# Patient Record
Sex: Female | Born: 1980 | Race: Black or African American | Hispanic: Yes | Marital: Single | Smoking: Current every day smoker
Health system: Southern US, Community
[De-identification: ages and names within clinical notes are randomized; demographics above are authoritative.]

## PROBLEM LIST (undated history)

## (undated) DIAGNOSIS — D649 Anemia, unspecified: Secondary | ICD-10-CM

## (undated) DIAGNOSIS — R519 Headache, unspecified: Secondary | ICD-10-CM

## (undated) DIAGNOSIS — L709 Acne, unspecified: Secondary | ICD-10-CM

## (undated) DIAGNOSIS — R51 Headache: Secondary | ICD-10-CM

## (undated) DIAGNOSIS — IMO0001 Reserved for inherently not codable concepts without codable children: Secondary | ICD-10-CM

## (undated) DIAGNOSIS — I499 Cardiac arrhythmia, unspecified: Secondary | ICD-10-CM

## (undated) DIAGNOSIS — K219 Gastro-esophageal reflux disease without esophagitis: Secondary | ICD-10-CM

## (undated) HISTORY — PX: CHOLECYSTECTOMY: SHX55

## (undated) HISTORY — DX: Headache, unspecified: R51.9

## (undated) HISTORY — DX: Anemia, unspecified: D64.9

## (undated) HISTORY — PX: TUBAL LIGATION: SHX77

## (undated) HISTORY — DX: Headache: R51

## (undated) HISTORY — DX: Acne, unspecified: L70.9

---

## 1998-05-15 ENCOUNTER — Emergency Department (HOSPITAL_COMMUNITY): Admission: EM | Admit: 1998-05-15 | Discharge: 1998-05-15 | Payer: Self-pay | Admitting: Emergency Medicine

## 1999-10-22 ENCOUNTER — Inpatient Hospital Stay (HOSPITAL_COMMUNITY): Admission: AD | Admit: 1999-10-22 | Discharge: 1999-10-22 | Payer: Self-pay | Admitting: Obstetrics

## 1999-12-18 ENCOUNTER — Ambulatory Visit (HOSPITAL_COMMUNITY): Admission: RE | Admit: 1999-12-18 | Discharge: 1999-12-18 | Payer: Self-pay | Admitting: Obstetrics & Gynecology

## 2000-01-03 ENCOUNTER — Ambulatory Visit (HOSPITAL_COMMUNITY): Admission: RE | Admit: 2000-01-03 | Discharge: 2000-01-03 | Payer: Self-pay | Admitting: *Deleted

## 2000-03-12 ENCOUNTER — Inpatient Hospital Stay (HOSPITAL_COMMUNITY): Admission: AD | Admit: 2000-03-12 | Discharge: 2000-03-12 | Payer: Self-pay | Admitting: *Deleted

## 2000-03-13 ENCOUNTER — Inpatient Hospital Stay (HOSPITAL_COMMUNITY): Admission: AD | Admit: 2000-03-13 | Discharge: 2000-03-13 | Payer: Self-pay | Admitting: *Deleted

## 2000-04-21 ENCOUNTER — Inpatient Hospital Stay (HOSPITAL_COMMUNITY): Admission: AD | Admit: 2000-04-21 | Discharge: 2000-04-21 | Payer: Self-pay | Admitting: Obstetrics

## 2000-05-22 ENCOUNTER — Encounter (HOSPITAL_COMMUNITY): Admission: RE | Admit: 2000-05-22 | Discharge: 2000-05-29 | Payer: Self-pay | Admitting: Obstetrics & Gynecology

## 2000-05-28 ENCOUNTER — Inpatient Hospital Stay (HOSPITAL_COMMUNITY): Admission: AD | Admit: 2000-05-28 | Discharge: 2000-05-28 | Payer: Self-pay | Admitting: *Deleted

## 2000-05-28 ENCOUNTER — Inpatient Hospital Stay (HOSPITAL_COMMUNITY): Admission: AD | Admit: 2000-05-28 | Discharge: 2000-05-30 | Payer: Self-pay | Admitting: *Deleted

## 2001-04-04 ENCOUNTER — Emergency Department (HOSPITAL_COMMUNITY): Admission: EM | Admit: 2001-04-04 | Discharge: 2001-04-05 | Payer: Self-pay | Admitting: Emergency Medicine

## 2001-06-18 ENCOUNTER — Inpatient Hospital Stay (HOSPITAL_COMMUNITY): Admission: AD | Admit: 2001-06-18 | Discharge: 2001-06-18 | Payer: Self-pay | Admitting: Obstetrics & Gynecology

## 2001-10-27 ENCOUNTER — Emergency Department (HOSPITAL_COMMUNITY): Admission: EM | Admit: 2001-10-27 | Discharge: 2001-10-27 | Payer: Self-pay | Admitting: Emergency Medicine

## 2002-01-09 ENCOUNTER — Emergency Department (HOSPITAL_COMMUNITY): Admission: EM | Admit: 2002-01-09 | Discharge: 2002-01-09 | Payer: Self-pay | Admitting: Emergency Medicine

## 2002-02-17 ENCOUNTER — Emergency Department (HOSPITAL_COMMUNITY): Admission: EM | Admit: 2002-02-17 | Discharge: 2002-02-17 | Payer: Self-pay | Admitting: Emergency Medicine

## 2002-06-07 ENCOUNTER — Ambulatory Visit (HOSPITAL_COMMUNITY): Admission: RE | Admit: 2002-06-07 | Discharge: 2002-06-07 | Payer: Self-pay | Admitting: *Deleted

## 2002-07-14 ENCOUNTER — Ambulatory Visit (HOSPITAL_COMMUNITY): Admission: RE | Admit: 2002-07-14 | Discharge: 2002-07-14 | Payer: Self-pay | Admitting: *Deleted

## 2002-12-11 ENCOUNTER — Inpatient Hospital Stay (HOSPITAL_COMMUNITY): Admission: AD | Admit: 2002-12-11 | Discharge: 2002-12-11 | Payer: Self-pay | Admitting: *Deleted

## 2002-12-17 ENCOUNTER — Encounter (HOSPITAL_COMMUNITY): Admission: RE | Admit: 2002-12-17 | Discharge: 2002-12-19 | Payer: Self-pay | Admitting: *Deleted

## 2002-12-20 ENCOUNTER — Inpatient Hospital Stay (HOSPITAL_COMMUNITY): Admission: AD | Admit: 2002-12-20 | Discharge: 2002-12-23 | Payer: Self-pay | Admitting: *Deleted

## 2002-12-21 ENCOUNTER — Encounter (INDEPENDENT_AMBULATORY_CARE_PROVIDER_SITE_OTHER): Payer: Self-pay | Admitting: Specialist

## 2002-12-22 ENCOUNTER — Encounter: Payer: Self-pay | Admitting: *Deleted

## 2003-10-20 ENCOUNTER — Emergency Department (HOSPITAL_COMMUNITY): Admission: EM | Admit: 2003-10-20 | Discharge: 2003-10-20 | Payer: Self-pay | Admitting: Emergency Medicine

## 2003-12-08 ENCOUNTER — Emergency Department (HOSPITAL_COMMUNITY): Admission: EM | Admit: 2003-12-08 | Discharge: 2003-12-08 | Payer: Self-pay | Admitting: Family Medicine

## 2004-01-08 ENCOUNTER — Emergency Department (HOSPITAL_COMMUNITY): Admission: EM | Admit: 2004-01-08 | Discharge: 2004-01-08 | Payer: Self-pay

## 2004-01-12 ENCOUNTER — Inpatient Hospital Stay (HOSPITAL_COMMUNITY): Admission: AD | Admit: 2004-01-12 | Discharge: 2004-01-12 | Payer: Self-pay | Admitting: Obstetrics & Gynecology

## 2004-01-17 ENCOUNTER — Other Ambulatory Visit: Admission: RE | Admit: 2004-01-17 | Discharge: 2004-01-17 | Payer: Self-pay | Admitting: Family Medicine

## 2004-01-17 ENCOUNTER — Encounter: Admission: RE | Admit: 2004-01-17 | Discharge: 2004-01-17 | Payer: Self-pay | Admitting: Family Medicine

## 2004-01-26 ENCOUNTER — Observation Stay (HOSPITAL_COMMUNITY): Admission: RE | Admit: 2004-01-26 | Discharge: 2004-01-27 | Payer: Self-pay | Admitting: General Surgery

## 2004-01-26 ENCOUNTER — Encounter (INDEPENDENT_AMBULATORY_CARE_PROVIDER_SITE_OTHER): Payer: Self-pay | Admitting: *Deleted

## 2004-02-02 ENCOUNTER — Encounter: Admission: RE | Admit: 2004-02-02 | Discharge: 2004-02-02 | Payer: Self-pay | Admitting: Family Medicine

## 2004-05-04 ENCOUNTER — Emergency Department (HOSPITAL_COMMUNITY): Admission: EM | Admit: 2004-05-04 | Discharge: 2004-05-04 | Payer: Self-pay | Admitting: Emergency Medicine

## 2006-11-27 DIAGNOSIS — K8020 Calculus of gallbladder without cholecystitis without obstruction: Secondary | ICD-10-CM | POA: Insufficient documentation

## 2006-11-27 DIAGNOSIS — K219 Gastro-esophageal reflux disease without esophagitis: Secondary | ICD-10-CM

## 2007-08-10 ENCOUNTER — Ambulatory Visit: Payer: Self-pay | Admitting: Family Medicine

## 2007-08-10 ENCOUNTER — Encounter: Payer: Self-pay | Admitting: Family Medicine

## 2007-08-10 DIAGNOSIS — N898 Other specified noninflammatory disorders of vagina: Secondary | ICD-10-CM | POA: Insufficient documentation

## 2007-08-10 LAB — CONVERTED CEMR LAB
Beta hcg, urine, semiquantitative: NEGATIVE
Whiff Test: POSITIVE

## 2007-08-11 ENCOUNTER — Encounter: Payer: Self-pay | Admitting: Family Medicine

## 2007-08-11 LAB — CONVERTED CEMR LAB
GC Probe Amp, Genital: NEGATIVE
RBC: 4.06 M/uL (ref 3.87–5.11)
TSH: 0.238 microintl units/mL — ABNORMAL LOW (ref 0.350–5.50)

## 2007-08-12 ENCOUNTER — Encounter: Payer: Self-pay | Admitting: Family Medicine

## 2007-08-12 LAB — CONVERTED CEMR LAB: T3, Free: 3 pg/mL (ref 2.3–4.2)

## 2007-08-13 ENCOUNTER — Encounter: Admission: RE | Admit: 2007-08-13 | Discharge: 2007-08-13 | Payer: Self-pay | Admitting: Family Medicine

## 2007-11-09 ENCOUNTER — Emergency Department (HOSPITAL_COMMUNITY): Admission: EM | Admit: 2007-11-09 | Discharge: 2007-11-09 | Payer: Self-pay | Admitting: Family Medicine

## 2008-01-07 ENCOUNTER — Emergency Department (HOSPITAL_COMMUNITY): Admission: EM | Admit: 2008-01-07 | Discharge: 2008-01-07 | Payer: Self-pay | Admitting: Emergency Medicine

## 2008-07-04 ENCOUNTER — Encounter: Payer: Self-pay | Admitting: Family Medicine

## 2008-10-07 ENCOUNTER — Encounter: Payer: Self-pay | Admitting: Family Medicine

## 2008-10-07 ENCOUNTER — Ambulatory Visit: Payer: Self-pay | Admitting: Family Medicine

## 2008-10-07 DIAGNOSIS — N920 Excessive and frequent menstruation with regular cycle: Secondary | ICD-10-CM

## 2008-10-07 DIAGNOSIS — F17200 Nicotine dependence, unspecified, uncomplicated: Secondary | ICD-10-CM

## 2008-10-07 DIAGNOSIS — D509 Iron deficiency anemia, unspecified: Secondary | ICD-10-CM

## 2008-10-07 DIAGNOSIS — K5900 Constipation, unspecified: Secondary | ICD-10-CM | POA: Insufficient documentation

## 2008-10-10 LAB — CONVERTED CEMR LAB
HCT: 28.2 % — ABNORMAL LOW (ref 36.0–46.0)
Platelets: 486 10*3/uL — ABNORMAL HIGH (ref 150–400)
RBC: 3.78 M/uL — ABNORMAL LOW (ref 3.87–5.11)
RDW: 18.5 % — ABNORMAL HIGH (ref 11.5–15.5)
UIBC: 353 ug/dL

## 2009-04-06 ENCOUNTER — Emergency Department (HOSPITAL_COMMUNITY): Admission: EM | Admit: 2009-04-06 | Discharge: 2009-04-06 | Payer: Self-pay | Admitting: Emergency Medicine

## 2009-12-01 ENCOUNTER — Emergency Department (HOSPITAL_COMMUNITY): Admission: EM | Admit: 2009-12-01 | Discharge: 2009-12-01 | Payer: Self-pay | Admitting: Emergency Medicine

## 2009-12-22 ENCOUNTER — Ambulatory Visit: Payer: Self-pay | Admitting: Family Medicine

## 2010-02-05 ENCOUNTER — Telehealth: Payer: Self-pay | Admitting: Family Medicine

## 2010-02-06 ENCOUNTER — Ambulatory Visit: Payer: Self-pay | Admitting: Family Medicine

## 2010-02-06 LAB — CONVERTED CEMR LAB: Whiff Test: NEGATIVE

## 2010-02-08 ENCOUNTER — Telehealth: Payer: Self-pay | Admitting: Family Medicine

## 2010-02-20 ENCOUNTER — Encounter: Payer: Self-pay | Admitting: Family Medicine

## 2010-07-31 ENCOUNTER — Encounter: Payer: Self-pay | Admitting: Family Medicine

## 2010-10-30 NOTE — Progress Notes (Signed)
Summary: triage  Phone Note Call from Patient Call back at Home Phone [phone]   Caller: Patient Summary of Call: Pt thinks she has another yeast infection and wondering if she needs to be seen again or can something be called in for her. Initial call taken by: Clydell Hakim,  Feb 05, 2010 10:49 AM  Follow-up for Phone Call        appt made with pcp tomorrow at 4:15. pt needed to work around her school schedule Follow-up by: Golden Circle RN,  Feb 05, 2010 10:53 AM

## 2010-10-30 NOTE — Assessment & Plan Note (Signed)
Summary: vag problem/[place]   Vital Signs:  Patient profile:   30 year old female Height:      63 inches Weight:      154 pounds BMI:     27.38 Temp:     98.1 degrees F oral Pulse rate:   82 / minute BP sitting:   96 / 70  (right arm) Cuff size:   regular  Vitals Entered By: Tessie Fass CMA (Feb 06, 2010 4:07 PM) CC: yeast infection? Is Patient Diabetic? No Pain Assessment Patient in pain? no        CC:  yeast infection?.  History of Present Illness: Describes severe vaginal itch and irritation.  Husband is only partner, they have a good relationship. Occasionally uses metrogel for odor, has not used it recently.  Denies being on antibotics.  Past history of vaginal candidiasis.  Denies abdominal pain, vaginal discharge, or dysuria.  Habits & Providers  Alcohol-Tobacco-Diet     Tobacco Status: current     Tobacco Counseling: to quit use of tobacco products     Cigarette Packs/Day: 1.0  Current Medications (verified): 1)  Ferrous Sulfate 325 (65 Fe) Mg Tbec (Ferrous Sulfate) 2)  Cleocin-T 1 % Lotn (Clindamycin Phosphate) .... Apply Two Times A Day , 1 Unti 3)  Metronidazole 0.75 % Gel (Metronidazole) .... Intervaginal Gel, One Applicator Full At Bedtime For 3 Nights To Treat Symptoms, Qs 4)  Fluconazole 150 Mg Tabs (Fluconazole) .... Take One Tab By Mouth X1, May Repeat in 3 Days If Symptoms Continue  Allergies: No Known Drug Allergies  Social History: Packs/Day:  1.0  Review of Systems      See HPI  Physical Exam  General:  Well-developed,well-nourished,in no acute distress; alert,appropriate and cooperative throughout examination Genitalia:  normal introitus, white vaginal discharge, red inflammed vaginal wall.  Wet prep did not show yeast but clinically appeard so.   Impression & Recommendations:  Problem # 1:  CANDIDIASIS, VAGINAL (ICD-112.1) Add topical antifungal cream for 3 nights. Her updated medication list for this problem includes:  Fluconazole 150 Mg Tabs (Fluconazole) .Marland Kitchen... Take one tab by mouth x1, may repeat in 3 days if symptoms continue  Orders: Glucose Cap-[facility] (84132) Wet Prep- [facility] (44010) [facility]- Est Level  3 (27253)  Complete Medication List: 1)  Ferrous Sulfate 325 (65 Fe) Mg Tbec (Ferrous sulfate) 2)  Cleocin-t 1 % Lotn (Clindamycin phosphate) .... Apply two times a day , 1 unti 3)  Metronidazole 0.75 % Gel (Metronidazole) .... Intervaginal gel, one applicator full at bedtime for 3 nights to treat symptoms, qs 4)  Fluconazole 150 Mg Tabs (Fluconazole) .... Take one tab by mouth x1, may repeat in 3 days if symptoms continue  Patient Instructions: 1)  clotrimazole or miconazole intravaginal cream use for 3 nights before you go to bed  Laboratory Results  Date/Time Received: Feb 06, 2010 4:24 PM   [employer] Source: vaginal WBC/hpf: 1-5 Bacteria/hpf: 3+  Rods Clue cells/hpf: none  Negative whiff Yeast/hpf: none Trichomonas/hpf: none Comments: occ sperm present ...........test performed by...........Marland KitchenTerese Door, CMA

## 2010-10-30 NOTE — Miscellaneous (Signed)
  Clinical Lists Changes  Problems: Removed problem of CANDIDIASIS, VAGINAL (ICD-112.1) Removed problem of CONTACT OR EXPOSURE TO OTHER VIRAL DISEASES (ICD-V01.79) Removed problem of SCREENING FOR MALIGNANT NEOPLASM OF THE CERVIX (ICD-V76.2) Removed problem of ABSENCE OF MENSTRUATION (ICD-626.0)

## 2010-10-30 NOTE — Progress Notes (Signed)
Summary: meds prob  Phone Note Call from Patient Call back at Home Phone [phone]   Caller: Patient Summary of Call: pt states that she hasn't gotten her rx - needs [facility] [address] Initial call taken by: De Nurse,  Feb 08, 2010 2:04 PM  Follow-up for Phone Call        she is talking about the fluconazole. told her I will send this to [facility] & she will send to pharmacy Follow-up by: Golden Circle RN,  Feb 08, 2010 2:14 PM    Prescriptions: FLUCONAZOLE 150 MG TABS (FLUCONAZOLE) take one tab by mouth x1, may repeat in 3 days if symptoms continue  #2 x 0   Entered and Authorized by:   Luretha Murphy NP   Signed by:   Luretha Murphy NP on 02/08/2010   Method used:   Electronically to        [facility] [id number]       [address]       [place], [place]  [address]       Ph: [phone] or [phone]       Fax: [phone]   RxID:   [id number]

## 2010-10-30 NOTE — Consult Note (Signed)
Summary: Administrator, [employer] Service   Imported By: De Nurse 02/20/2010 16:03:56  _____________________________________________________________________  External Attachment:    Type:   Image     Comment:   External Document

## 2010-10-30 NOTE — Assessment & Plan Note (Signed)
Summary: cpe/pap,tcb   Vital Signs:  Patient profile:   30 year old female Height:      63 inches Weight:      154.9 pounds BMI:     27.54 Pulse rate:   90 / minute BP sitting:   104 / 60  (left arm)  Vitals Entered By: Arlyss Repress CMA, (December 22, 2009 1:33 PM) CC: physical. pap. vag d/c x 1 day Is Patient Diabetic? No Pain Assessment Patient in pain? no        CC:  physical. pap. vag d/c x 1 day.  History of Present Illness: Patient here for CPE and c/o vaginal itching and irritation that began after being on abx therapy two weeks ago.  Discharge is thick and without odor, not associated with abdominal pain, n/v, or dysuria.  Unsure of LMP, hx of tubal ligation and patient is married.  Expresses no concern for STD.   Habits & Providers  Alcohol-Tobacco-Diet     Tobacco Status: current     Tobacco Counseling: to quit use of tobacco products  Current Medications (verified): 1)  Ferrous Sulfate 325 (65 Fe) Mg Tbec (Ferrous Sulfate) 2)  Cleocin-T 1 % Lotn (Clindamycin Phosphate) .... Apply Two Times A Day , 1 Unti 3)  Metronidazole 0.75 % Gel (Metronidazole) .... Intervaginal Gel, One Applicator Full At Bedtime For 3 Nights To Treat Symptoms, Qs 4)  Fluconazole 150 Mg Tabs (Fluconazole) .... Take One Tab By Mouth X1, May Repeat in 3 Days If Symptoms Continue  Allergies (verified): No Known Drug Allergies  Physical Exam  General:  Well-developed,well-nourished,in no acute distress; alert,appropriate and cooperative throughout examination Head:  Normocephalic and atraumatic without obvious abnormalities. No apparent alopecia or balding. Eyes:  No corneal or conjunctival inflammation noted. EOMI. Perrla. Vision grossly normal. Ears:  External ear exam shows no significant lesions or deformities.  Otoscopic examination reveals clear canals, tympanic membranes are intact bilaterally without bulging, retraction, inflammation or discharge. Hearing is grossly normal  bilaterally. Mouth:  Oral mucosa and oropharynx without lesions or exudates.  Teeth in good repair. Neck:  No deformities, masses, or tenderness noted. Breasts:  No mass, nodules, thickening, tenderness, bulging, retraction, inflamation, nipple discharge or skin changes noted.   Lungs:  Normal respiratory effort, chest expands symmetrically. Lungs are clear to auscultation, no crackles or wheezes. Heart:  Normal rate and regular rhythm. S1 and S2 normal without gallop, murmur, click, rub or other extra sounds. Abdomen:  Bowel sounds positive,abdomen soft and non-tender without masses, organomegaly or hernias noted. Genitalia:  Normal introitus for age, no external lesions, copious amount thick white vaginal discharge, mucosa pink and moist, no vaginal or cervical lesions, no vaginal atrophy, no friaility or hemorrhage, normal uterus size and position, no adnexal masses or tenderness Msk:  No deformity or scoliosis noted of thoracic or lumbar spine.   Pulses:  R and L carotid,radial,femoral,dorsalis pedis and posterior tibial pulses are full and equal bilaterally Extremities:  No clubbing, cyanosis, edema, or deformity noted with normal full range of motion of all joints.   Neurologic:  No cranial nerve deficits noted. Station and gait are normal. Plantar reflexes are down-going bilaterally. DTRs are symmetrical throughout. Sensory, motor and coordinative functions appear intact. Skin:  Intact without suspicious lesions or rashes Cervical Nodes:  No lymphadenopathy noted Axillary Nodes:  No palpable lymphadenopathy Inguinal Nodes:  No significant adenopathy Psych:  Cognition and judgment appear intact. Alert and cooperative with normal attention span and concentration. No apparent delusions, illusions, hallucinations  Impression & Recommendations:  Problem # 1:  Gynecological examination-routine (ICD-V72.31)  Problem # 2:  CANDIDIASIS, VAGINAL (ICD-112.1)  Her updated medication list for  this problem includes:    Fluconazole 150 Mg Tabs (Fluconazole) .Marland Kitchen... Take one tab by mouth x1, may repeat in 3 days if symptoms continue  Orders: [facility] - Est  18-39 yrs ([address])  Complete Medication List: 1)  Ferrous Sulfate 325 (65 Fe) Mg Tbec (Ferrous sulfate) 2)  Cleocin-t 1 % Lotn (Clindamycin phosphate) .... Apply two times a day , 1 unti 3)  Metronidazole 0.75 % Gel (Metronidazole) .... Intervaginal gel, one applicator full at bedtime for 3 nights to treat symptoms, qs 4)  Fluconazole 150 Mg Tabs (Fluconazole) .... Take one tab by mouth x1, may repeat in 3 days if symptoms continue  [facility] Pap Sm[facility] ([id number])  Patient Instructions: 1)  Self Breast Exams Monthly. 2)  Take medication as prescribed. 3)  Condoms for STD Prevention.   4)  Return if symptoms persist or worsen. Prescriptions: FLUCONAZOLE 150 MG TABS (FLUCONAZOLE) take one tab by mouth x1, may repeat in 3 days if symptoms continue  #2 x 0   Entered and Authorized by:   Luretha Murphy NP   Signed by:   Luretha Murphy NP on 12/22/2009   Method used:   Electronically to        [facility]       [address]       [place], [place]  [address]       Ph: [phone] or [phone]       Fax: [phone]   RxID:   [id number]   Laboratory Results  Date/Time Received: December 22, 2009 1:58 PM  Date/Time Reported: December 22, 2009 2:08 PM   Wet [place] Source: vag WBC/hpf: rare Bacteria/hpf: 1+  Rods Clue cells/hpf: none  Negative whiff Yeast/hpf: few spores and hyphae Trichomonas/hpf: none Comments: ...............test performed by......Marland KitchenBonnie A. [place], MLS (ASCP)cm

## 2010-10-30 NOTE — Miscellaneous (Signed)
  Clinical Lists Changes  Medications: Changed medication from FERROUS SULFATE 325 (65 FE) MG TBEC (FERROUS SULFATE) to FERROUS SULFATE 325 (65 FE) MG TBEC (FERROUS SULFATE) c [BMN] - Signed Rx of FERROUS SULFATE 325 (65 FE) MG TBEC (FERROUS SULFATE) c;  #1 x 0 Brand medically necessary;  Signed;  Entered by: Luretha Murphy NP;  Authorized by: Luretha Murphy NP;  Method used: Print then Give to Patient    Prescriptions: FERROUS SULFATE 325 (65 FE) MG TBEC (FERROUS SULFATE) c Brand medically necessary #1 x 0   Entered and Authorized by:   Luretha Murphy NP   Signed by:   Luretha Murphy NP on 07/31/2010   Method used:   Print then Give to Patient   RxID:   [id number]

## 2010-11-07 ENCOUNTER — Encounter: Payer: Self-pay | Admitting: *Deleted

## 2010-12-18 LAB — GLUCOSE, CAPILLARY: Glucose-Capillary: 83 mg/dL (ref 70–99)

## 2011-01-22 ENCOUNTER — Ambulatory Visit (INDEPENDENT_AMBULATORY_CARE_PROVIDER_SITE_OTHER): Payer: Medicaid Other | Admitting: Family Medicine

## 2011-01-22 ENCOUNTER — Encounter: Payer: Self-pay | Admitting: Family Medicine

## 2011-01-22 VITALS — BP 109/69 | HR 81 | Temp 98.5°F | Ht 61.0 in | Wt 156.6 lb

## 2011-01-22 DIAGNOSIS — D649 Anemia, unspecified: Secondary | ICD-10-CM

## 2011-01-22 DIAGNOSIS — L708 Other acne: Secondary | ICD-10-CM

## 2011-01-22 DIAGNOSIS — Z23 Encounter for immunization: Secondary | ICD-10-CM

## 2011-01-22 DIAGNOSIS — L709 Acne, unspecified: Secondary | ICD-10-CM

## 2011-01-22 DIAGNOSIS — N760 Acute vaginitis: Secondary | ICD-10-CM

## 2011-01-22 DIAGNOSIS — B9689 Other specified bacterial agents as the cause of diseases classified elsewhere: Secondary | ICD-10-CM | POA: Insufficient documentation

## 2011-01-22 DIAGNOSIS — A499 Bacterial infection, unspecified: Secondary | ICD-10-CM

## 2011-01-22 HISTORY — DX: Acne, unspecified: L70.9

## 2011-01-22 LAB — CBC
HCT: 31.7 % — ABNORMAL LOW (ref 36.0–46.0)
Hemoglobin: 9.8 g/dL — ABNORMAL LOW (ref 12.0–15.0)
MCH: 25.9 pg — ABNORMAL LOW (ref 26.0–34.0)
Platelets: 388 10*3/uL (ref 150–400)
RBC: 3.78 MIL/uL — ABNORMAL LOW (ref 3.87–5.11)
RDW: 18.2 % — ABNORMAL HIGH (ref 11.5–15.5)
WBC: 6.9 10*3/uL (ref 4.0–10.5)

## 2011-01-22 MED ORDER — CLINDAMYCIN PHOSPHATE 1 % EX LOTN: TOPICAL_LOTION | Freq: Two times a day (BID) | CUTANEOUS | Status: DC

## 2011-01-22 MED ORDER — TETANUS-DIPHTH-ACELL PERTUSSIS 5-2.5-18.5 LF-MCG/0.5 IM SUSP
0.5000 mL | Freq: Once | INTRAMUSCULAR | Status: AC
  Administered 2011-01-22: 0.5 mL via INTRAMUSCULAR

## 2011-01-22 MED ORDER — METRONIDAZOLE 0.75 % VA GEL: 1.0000 | Freq: Two times a day (BID) | VAGINAL | Status: AC

## 2011-01-22 NOTE — Assessment & Plan Note (Signed)
Refilled metrogel for intermittent self treatment, low risk for STD

## 2011-01-22 NOTE — Assessment & Plan Note (Signed)
CBC and restart iron as she is experiencing pica

## 2011-01-22 NOTE — Progress Notes (Signed)
Addended byJimmy Footman on: 01/22/2011 05:07 PM   Modules accepted: Orders

## 2011-01-22 NOTE — Patient Instructions (Signed)
Iron sulfate 325 mg one-two daily for 6 months Advise to think about quitting tobacco Eat healthy and get regular exercise

## 2011-01-22 NOTE — Progress Notes (Signed)
  Subjective:    Patient ID: Kaitlyn West, female    DOB: 09-Jul-1981, 30 y.o.   MRN: [id number]  HPI Annual check up, has no major complaints.  Has started craving ice recently, took her iron some years ago but stopped.  Menses are about 5 days and not severe.  Has had long standing anemia.  In school and working, has two girls.  Honors grades for dental hygiene.  Intermittent BV, self treats when she gets the odor.   Review of Systems  Constitutional: Negative for fever and fatigue.  HENT: Negative for rhinorrhea.   Respiratory: Negative for cough and shortness of breath.   Cardiovascular: Negative for chest pain.  Gastrointestinal: Negative for constipation.  Genitourinary: Negative for dysuria and vaginal discharge.  Musculoskeletal: Negative.   Skin:       Dark circles under eyes, acne  Psychiatric/Behavioral: Negative for dysphoric mood.       Objective:   Physical Exam  Constitutional: She appears well-developed and well-nourished.  HENT:  Right Ear: External ear normal.  Left Ear: External ear normal.  Mouth/Throat: Oropharynx is clear and moist.  Eyes: Conjunctivae and EOM are normal. Pupils are equal, round, and reactive to light.  Neck: Normal range of motion. No thyromegaly present.  Cardiovascular: Normal rate, regular rhythm and normal heart sounds.   Pulmonary/Chest: Effort normal and breath sounds normal.  Abdominal: Soft. Bowel sounds are normal.  Genitourinary:       Deferred, normal PAP, not at risk for STD  Musculoskeletal: Normal range of motion.  Lymphadenopathy:    She has no cervical adenopathy.  Skin:       Mild acne, multiple tattoos  Psychiatric: She has a normal mood and affect.          Assessment & Plan:

## 2011-01-22 NOTE — Assessment & Plan Note (Signed)
Counseled to quit, no quit ready

## 2011-01-23 ENCOUNTER — Encounter: Payer: Self-pay | Admitting: Family Medicine

## 2011-02-15 NOTE — Op Note (Signed)
NAMEYEXALEN, DEIKE                          ACCOUNT NO.:  [id number]   MEDICAL RECORD NO.:  [id number]                   PATIENT TYPE:  INP   LOCATION:  9130                                 FACILITY:  [facility]   PHYSICIAN:  Phil D. Okey Dupre, M.D.                  DATE OF BIRTH:  08-07-1981   DATE OF PROCEDURE:  12/21/2002  DATE OF DISCHARGE:                                 OPERATIVE REPORT   PROCEDURE:  Modified Pomeroy bilateral tubal ligation and partial  salpingectomy.   PREOPERATIVE DIAGNOSES:  Voluntary sterilization.   POSTOPERATIVE DIAGNOSES:  Voluntary sterilization.   SURGEON:  Javier Glazier. Okey Dupre, M.D.   PROCEDURE:  On the morning after delivery the patient was taken to the  operating room, placed in a dorsal supine position, and had her epidural  anesthesia reinforced.  In the dorsal supine position the abdomen was  prepped and draped in the usual sterile manner and entered through a  transverse subumbilical incision situated 0.5 cm below the umbilicus and  running for a total length of 4 cm.  The abdomen was entered by layers and  on entering the peritoneal cavity each fallopian tube was grasped from the  mid point with a Babcock clamp and opening made with a hemostat in avascular  portion of the meso beneath the tube and a number 1 plain suture brought  through this and tied around at the distal and proximal ends of the tube to  form a loop held up by the Babcock clamp of approximately 2 cm in length.  The second tie was placed just below the aforementioned tie and this was cut  short.  The section of tube above the first tie was excised and sent for  pathological diagnosis.  The ends of the tube that had been held by the  Babcock clamp were coagulated with hot cautery to reinforce closure.  The  tubes were then placed back into the peritoneal cavity and the incision  closed with a continuous running 0 chromic on an atraumatic needle.  The  closure included the peritoneum as  well as the fascia.  Small subcuticular  bleeders were controlled with hot cautery and the incision was closed with  Dermabond.  Dry sterile dressing was applied and the patient transferred to  recovery room in satisfactory condition with a blood loss of less than 10  mL.  Surgical specimen sent for pathological diagnosis was approximately a 2  cm link section of each fallopian tube.  Tape, instrument, sponge, and  needle count reported correct at the end of procedure.                                               Phil D. Okey Dupre, M.D.    PDR/MEDQ  D:  12/21/2002  T:  12/21/2002  Job:  [id number]

## 2011-02-15 NOTE — Op Note (Signed)
NAMESULLIVAN, Kaitlyn West                          ACCOUNT NO.:  [id number]   MEDICAL RECORD NO.:  [id number]                   PATIENT TYPE:  AMB   LOCATION:  DAY                                  FACILITY:  [facility]   PHYSICIAN:  Angelia Mould. Derrell Lolling, M.D.             DATE OF BIRTH:  [birth year]   DATE OF PROCEDURE:  01/26/2004  DATE OF DISCHARGE:                                 OPERATIVE REPORT   PREOPERATIVE DIAGNOSIS:  Chronic cholecystitis with cholelithiasis.   POSTOPERATIVE DIAGNOSIS:  Chronic cholecystitis with cholelithiasis.   OPERATION PERFORMED:  Laparoscopic cholecystectomy with intraoperative  cholangiogram.   SURGEON:  Angelia Mould. Derrell Lolling, M.D.   FIRST ASSISTANT:  Anselm Pancoast. Zachery Dakins, M.D.   OPERATIVE INDICATIONS:  This is a 30 year old black female who has a one-  year history of intermittent episodes of postprandial right upper quadrant  pain, right flank pain, and nausea.  Ultrasound shows multiple gallstones,  otherwise unremarkable.  Lab work showed SGOT of 77 and SGPT of 117.  She is  brought to the operating room electively for cholecystectomy.   OPERATIVE FINDINGS:  The gallbladder is chronically inflamed.  It contained  numerous yellow gallstones.  The anatomy of the cystic duct, cystic artery,  and common bile duct were conventional.  The cholangiogram was normal,  showing normal intrahepatic and extrahepatic bile ducts.  No filling  defects.  There was no obstruction with prompt flow of contrast into the  duodenum.  The liver, stomach, duodenum, small intestine, and large  intestine all look normal.  The uterus was slightly enlarged.  There was a  large left ovarian cyst which appeared benign.  This was about 2.5 cm in  size.  No other abnormalities were noted.   OPERATIVE TECHNIQUE:  Following the induction of general endotracheal  anesthesia, the patient's abdomen was prepped and draped in a sterile  fashion.  Then 0.5% Marcaine with epinephrine was used as a  local  infiltration anesthetic.  The patient has had a laparoscopic tubal.  There  was a transverse infraumbilical incision which was slightly keloid.  I  excised this scar.  I then incised the fascia in the midline.  The abdominal  cavity was entered under direct vision.  A 10 mm Hasson trocar was inserted  and secured with a purse string suture of 0 Vicryl.  A pneumoperitoneum was  created.  The video camera was inserted with visualization and findings as  described above.  The 10 mm trocar was placed in the subxiphoid region.  Two  5 mm trocars were placed in the right mid abdomen.  The gallbladder was  elevated and retracted.  Adhesions were taken down.  I dissected out the  cystic duct and the cystic artery.  The cystic artery was isolated as it  went under the gallbladder, separately with an anterior branch and a  posterior branch.  These branches were controlled with multiple metal  clips  and divided.  This created a large window behind the cystic duct.  Metal  clips were placed on the cystic duct close to the gallbladder.  A  cholangiogram catheter was inserted into the cystic duct, and a  cholangiogram was obtained using the C -arm.  This showed normal  intrahepatic and extrahepatic bile ducts.  No filling defects.  Prompt flow  of contrast into the duodenum.  The cholangiogram catheter was removed.  The  cystic duct was secured with multiple metal clips and divided.  The  gallbladder was dissected from its bed with electrocautery and removed  through the umbilical port site.  The operative field was copiously  irrigated.  Irrigation fluid was completely clear at the end of the case.  There was no bleeding and no bile leak whatsoever.  The trocars were removed  under direct vision.  There was no bleeding from the trocar sites.  The  pneumoperitoneum was released.  The fascia at the umbilicus was closed with  0 Vicryl sutures.  The skin incisions were irrigated with saline and  closed  with subcuticular stitches of 4-0 Vicryl and Steri-Strips.  Clean bandages  were placed, and the patient was taken to the recovery room in stable  condition.  Estimated blood loss was about 10 cc.   COMPLICATIONS:  None.   SPONGE, NEEDLE, INSTRUMENT COUNTS:  Correct.                                               Angelia Mould. Derrell Lolling, M.D.    HMI/MEDQ  D:  01/26/2004  T:  01/26/2004  Job:  [id number]   cc:   Sibyl Parr. Darrick Penna, M.D.  Fax: [phone]

## 2011-02-15 NOTE — Discharge Summary (Signed)
Kaitlyn West, Kaitlyn West                          ACCOUNT NO.:  [id number]   MEDICAL RECORD NO.:  [id number]                   PATIENT TYPE:  INP   LOCATION:  9130                                 FACILITY:  [facility]   PHYSICIAN:  Conni Elliot, M.D.             DATE OF BIRTH:  [birth year]   DATE OF ADMISSION:  12/20/2002  DATE OF DISCHARGE:  12/23/2002                                 DISCHARGE SUMMARY   DISCHARGE DIAGNOSES:  1. G2, P2-0-0-2 status post normal spontaneous vaginal delivery healthy baby     girl.  2. Status post bilateral tubal ligation.  3. Cholelithiasis without cholecystitis.   CONSULTATIONS:  Phil D. Okey Dupre, M.D.   PERTINENT LABORATORY INFORMATION/STUDIES:  Admission CBC:  White blood cell  count 8.2, hemoglobin 10.6, hematocrit 32.0, platelets 261,000.  Follow-up  white blood cell count on March 23 was elevated at 15.5.  However, repeat  CBC showed white blood cell count of 9.9, hemoglobin 9.4, hematocrit 28.8,  platelets 229,000.  Studies:  Abdominal CT performed 03/25/2024for right  lower quadrant abdominal pain was negative for appendicitis, but did show  cholelithiasis without evidence of cholecystitis.   HOSPITAL COURSE:  The patient is a very pleasant 30 year old African-  American female G2, now P2-0-0-2 admitted December 20, 2002 for induction of  labor for post dates.  The patient was admitted to L&D, started on low dose  Pitocin on the morning of March 22 and did very well.  The patient's  antepartum course complicated by some perineal swelling but patient did  deliver at 1635 viable baby girl with Apgars of 8/1, 9/5.  Nuchal cord x1  reduced at the perineum after midline episiotomy cut.  Midline episiotomy  repaired with 3-0 Vicryl without complications.  The following day patient  underwent bilateral tubal ligation without complication and was doing well  without complaints postpartum day number one and postoperative day number  zero.  On postpartum day  number two, postoperative day number one patient  experienced right lower quadrant abdominal discomfort and was sent for CT  abdomen with a suspicion for appendicitis.  As above the CT was negative for  appendicitis, but did show incidental cholelithiasis.   On the day of discharge patient is doing well.  Abdominal pain markedly  improved after a bowel movement.  She is breast-feeding and is ready to go  home.   PLANS FOR DISCHARGE:  The patient will be discharged to home today.  She is  to follow up on December 30, 2002 with Dr. Okey Dupre at the [facility].  Routine  discharge instructions.  Discharge diet:  Regular.  Discharge activity:  Nothing per vagina for six weeks.   DISCHARGE MEDICATIONS:  1. Lortab 5/500 one p.o. q.4-6h. p.r.n. number 30.  2. Ibuprofen 800 mg one p.o. t.i.d. p.r.n. number 60.  3. Colace 100 mg one p.o. daily to be taken while taking Lortab.  Obstetrics Resident                       Conni Elliot, M.D.   OR/MEDQ  D:  12/23/2002  T:  12/23/2002  Job:  [id number]

## 2011-02-22 ENCOUNTER — Inpatient Hospital Stay (INDEPENDENT_AMBULATORY_CARE_PROVIDER_SITE_OTHER)
Admission: RE | Admit: 2011-02-22 | Discharge: 2011-02-22 | Disposition: A | Discharge status: Home / Self Care | Payer: Managed Care, Other (non HMO) | Source: Ambulatory Visit | Attending: Family Medicine | Admitting: Family Medicine

## 2011-02-22 DIAGNOSIS — J019 Acute sinusitis, unspecified: Secondary | ICD-10-CM

## 2011-06-21 LAB — POCT URINALYSIS DIP (DEVICE)
Ketones, ur: NEGATIVE
Nitrite: POSITIVE — AB
Operator id: 116391

## 2011-06-21 LAB — POCT PREGNANCY, URINE: Operator id: 235561

## 2011-06-25 LAB — URINALYSIS, ROUTINE W REFLEX MICROSCOPIC
Glucose, UA: NEGATIVE
Hgb urine dipstick: NEGATIVE
Ketones, ur: NEGATIVE
Nitrite: NEGATIVE
pH: 6.5

## 2011-06-25 LAB — BASIC METABOLIC PANEL
Calcium: 8.6
Glucose, Bld: 95
Potassium: 4.1
Sodium: 138

## 2011-06-25 LAB — DIFFERENTIAL
Basophils Absolute: 0
Lymphs Abs: 1
Monocytes Absolute: 0.3
Monocytes Relative: 6
Neutrophils Relative %: 71

## 2011-06-25 LAB — WET PREP, GENITAL
Clue Cells Wet Prep HPF POC: NONE SEEN
Trich, Wet Prep: NONE SEEN

## 2011-06-25 LAB — CBC
Platelets: 330
RBC: 3.81 — ABNORMAL LOW
WBC: 4.7

## 2011-06-25 LAB — PREGNANCY, URINE: Preg Test, Ur: NEGATIVE

## 2012-02-11 ENCOUNTER — Ambulatory Visit: Payer: Managed Care, Other (non HMO) | Admitting: Family Medicine

## 2013-02-01 ENCOUNTER — Ambulatory Visit: Payer: Managed Care, Other (non HMO) | Admitting: Family Medicine

## 2013-02-08 ENCOUNTER — Ambulatory Visit: Payer: Managed Care, Other (non HMO) | Admitting: Family Medicine

## 2013-04-01 ENCOUNTER — Other Ambulatory Visit (HOSPITAL_COMMUNITY)
Admission: RE | Admit: 2013-04-01 | Discharge: 2013-04-01 | Disposition: A | Discharge status: Home / Self Care | Payer: BC Managed Care – PPO | Source: Ambulatory Visit | Attending: Family Medicine | Admitting: Family Medicine

## 2013-04-01 ENCOUNTER — Ambulatory Visit (INDEPENDENT_AMBULATORY_CARE_PROVIDER_SITE_OTHER): Payer: BC Managed Care – PPO | Admitting: Family Medicine

## 2013-04-01 ENCOUNTER — Encounter: Payer: Self-pay | Admitting: Family Medicine

## 2013-04-01 VITALS — BP 109/67 | HR 96 | Temp 98.0°F | Ht 61.0 in | Wt 161.0 lb

## 2013-04-01 DIAGNOSIS — F17200 Nicotine dependence, unspecified, uncomplicated: Secondary | ICD-10-CM

## 2013-04-01 DIAGNOSIS — Z124 Encounter for screening for malignant neoplasm of cervix: Secondary | ICD-10-CM

## 2013-04-01 DIAGNOSIS — G43909 Migraine, unspecified, not intractable, without status migrainosus: Secondary | ICD-10-CM

## 2013-04-01 DIAGNOSIS — Z1151 Encounter for screening for human papillomavirus (HPV): Secondary | ICD-10-CM | POA: Insufficient documentation

## 2013-04-01 DIAGNOSIS — R609 Edema, unspecified: Secondary | ICD-10-CM

## 2013-04-01 DIAGNOSIS — D649 Anemia, unspecified: Secondary | ICD-10-CM

## 2013-04-01 DIAGNOSIS — G43829 Menstrual migraine, not intractable, without status migrainosus: Secondary | ICD-10-CM

## 2013-04-01 DIAGNOSIS — Z01419 Encounter for gynecological examination (general) (routine) without abnormal findings: Secondary | ICD-10-CM | POA: Insufficient documentation

## 2013-04-01 DIAGNOSIS — Z0000 Encounter for general adult medical examination without abnormal findings: Secondary | ICD-10-CM | POA: Insufficient documentation

## 2013-04-01 DIAGNOSIS — R600 Localized edema: Secondary | ICD-10-CM | POA: Insufficient documentation

## 2013-04-01 HISTORY — DX: Migraine, unspecified, not intractable, without status migrainosus: G43.909

## 2013-04-01 LAB — CBC
Hemoglobin: 10.1 g/dL — ABNORMAL LOW (ref 12.0–15.0)
MCV: 83.7 fL (ref 78.0–100.0)
RBC: 3.87 MIL/uL (ref 3.87–5.11)
RDW: 17.2 % — ABNORMAL HIGH (ref 11.5–15.5)
WBC: 6.7 10*3/uL (ref 4.0–10.5)

## 2013-04-01 LAB — IBC PANEL: UIBC: 341 ug/dL (ref 125–400)

## 2013-04-01 LAB — IRON: Iron: 16 ug/dL — ABNORMAL LOW (ref 42–145)

## 2013-04-01 LAB — LIPID PANEL
Cholesterol: 186 mg/dL (ref 0–200)
LDL Cholesterol: 133 mg/dL — ABNORMAL HIGH (ref 0–99)
Total CHOL/HDL Ratio: 5.6 Ratio
VLDL: 20 mg/dL (ref 0–40)

## 2013-04-01 MED ORDER — SUMATRIPTAN SUCCINATE 25 MG PO TABS: ORAL_TABLET | ORAL | Status: DC

## 2013-04-01 MED ORDER — NICOTINE POLACRILEX 4 MG MT GUM: 4.0000 mg | CHEWING_GUM | OROMUCOSAL | Status: DC | PRN

## 2013-04-01 MED ORDER — METRONIDAZOLE 0.75 % EX GEL: Freq: Two times a day (BID) | CUTANEOUS | Status: DC

## 2013-04-01 MED ORDER — VERAPAMIL HCL 80 MG PO TABS: 80.0000 mg | ORAL_TABLET | Freq: Three times a day (TID) | ORAL | Status: DC

## 2013-04-01 NOTE — Assessment & Plan Note (Signed)
A: ready to quit. And discussed options. Patient was interested in nicotine replacement. She's also interested in possibly trying Wellbutrin. She was not interested in Chantix. She has tried the nicotine patch in the past and has been unsuccessful quitting. P.: Nicotine replacement with Nicorette gum.

## 2013-04-01 NOTE — Assessment & Plan Note (Signed)
A: history consistent with migraines P: Verapamil for prophylaxis Imitrex for treatment Counseled patient regarding smoking cessation and avoiding excess salt, sugar, caffeine. Also in having a regular sleep pattern. And avoiding stress.

## 2013-04-01 NOTE — Assessment & Plan Note (Addendum)
A: intermittent. Differentials include anemia, hypoalbuminemia, pelvic mass. There is no significant mass palpable on pelvic exam. Patient has no sinus symptoms to suggest right-sided heart failure. She she is overweight but not obese and not heavy enough to account for edema. P.: With screen for anemia with a CBC. Also check CMP.  Advised that patient avoid excess salt. Patient advised to keep legs elevated. May need compression hose in the future. Will avoid diuretics w/o documented CHF.

## 2013-04-01 NOTE — Assessment & Plan Note (Signed)
A: history of IDA. P: check CBC and iron studies.

## 2013-04-01 NOTE — Patient Instructions (Addendum)
Kaitlyn West,  Thank you very much for coming in today. Please start verapamil for headache prevention Take Imitrex for migraine headache treatment. Stop goody powders.   For smoking, start gum in place of cigarette.   I will be in touch with pap results and blood work.  Dr. Armen Pickup

## 2013-04-01 NOTE — Assessment & Plan Note (Signed)
Pap done today. Screening HIV and screening lipid panel.

## 2013-04-01 NOTE — Progress Notes (Signed)
Subjective:     Patient ID: Kaitlyn West, female   DOB: 08/22/1981, 32 y.o.   MRN: [id number]  HPI 32 year old female presents for physical and to discuss the following:  #1 bilateral foot swelling: Swelling ongoing for the past year. Swelling is intermittent. Swelling is not associated with activity. There is no preceding injury. Associated symptoms include tightness. Patient has no history of DVT. She does have a history of iron deficiency anemia.  #2 headaches: Patient with chronic headaches. She's been [facility] the [facility]. Her last treatment there was in 2010 and she had Botox injections. She currently is not taking any prophylaxis. She admits to headaches most days of the month. Headaches in the last 24 hours. The headaches tend to be right-sided or occipital. The headaches are worse around menstruation. The pain is described as pressure associated with sensitivity to light and sound, nausea no vomiting.. She denies aura. Pain is 10 out of 10 severity. She has a mild headache today from yesterday that is about 2/10 severity. She is a smoker. She denies illicit drug use. She does have a family history of migraines in her mother brother and daughter. Her pain is partially relieved with goody powders she tends to take two Goody  powders twice daily.   #3 heavy menses: Patient reports that her menses are heavy with palm-sized blood clots. Her cycle lasts 5-7 days. She does not have significant cramping. She does have a history of iron deficiency anemia. She is noncompliant with iron.  #4 tobacco abuse: Patient smokes a half-pack a day of [place] regular. She is ready to quit. Smoking is a form of pleasure history this relief for her.  Review of Systems Patient Information Form: Screening and ROS  AUDIT-C Score: 0 Do you feel safe in relationships? yes PHQ-2:negative  Review of Symptoms  General:  Negative for nexplained weight loss, fever Skin: Negative for  new or changing mole, sore that won't heal HEENT: Negative for trouble hearing, trouble seeing, ringing in ears, mouth sores, hoarseness, change in voice, dysphagia. CV:  Positive for swelling of feet and palpitatations. Negative for chest pain, dyspnea, palpitations Resp: Negative for cough, dyspnea, hemoptysis GI: Negative for nausea, vomiting, diarrhea, constipation, abdominal pain, melena, hematochezia. GU: Negative for dysuria, incontinence, urinary hesitance, hematuria, vaginal or penile discharge, polyuria, sexual difficulty, lumps in testicle or breasts MSK: Negative for muscle cramps or aches, joint pain or swelling Neuro: Positive for headaches. Negative for weakness, numbness, dizziness, passing out/fainting Psych: Negative for depression, anxiety, memory problems    Objective:   Physical Exam BP 109/67  Pulse 96  Temp(Src) 98 F (36.7 C) (Oral)  Ht 5\' 1"  (1.549 m)  Wt 161 lb (73.029 kg)  BMI 30.44 kg/m2  LMP 03/26/2013 General appearance: alert, cooperative and no distress Head: Normocephalic, without obvious abnormality, atraumatic Eyes: conjunctivae/corneas clear. PERRL, EOM's intact.  Ears: normal TM's and external ear canals both ears Nose: no discharge, turbinates pink, swollen, no sinus tenderness  Throat: lips, mucosa, and tongue normal; teeth and gums normal Neck: no adenopathy, no JVD, supple, symmetrical, trachea midline and thyroid not enlarged, symmetric, no tenderness/mass/nodules Lungs: clear to auscultation bilaterally Breasts: normal appearance, no masses or tenderness Heart: regular rate and rhythm, S1, S2 normal, no murmur, click, rub or gallop Abdomen: soft, non-tender; bowel sounds normal; no masses,  no organomegaly Pelvic: external genitalia normal, no vaginal discharge, cervix with 2 wart like lesions. No bleeding. No CMT, no adnexal tenderness, ?  Fibroid R side.  Extremities: extremities normal, atraumatic, no cyanosis or edema Pulses: 2+ and  symmetric Skin: Skin color, texture, turgor normal. No rashes or lesions Neurologic: Grossly normal    Assessment and Plan:

## 2013-04-02 LAB — COMPLETE METABOLIC PANEL WITH GFR
ALT: 21 U/L (ref 0–35)
Albumin: 4.1 g/dL (ref 3.5–5.2)
BUN: 10 mg/dL (ref 6–23)
Creat: 0.76 mg/dL (ref 0.50–1.10)
Sodium: 138 mEq/L (ref 135–145)
Total Bilirubin: 0.4 mg/dL (ref 0.3–1.2)
Total Protein: 7.4 g/dL (ref 6.0–8.3)

## 2013-04-05 ENCOUNTER — Encounter: Payer: Self-pay | Admitting: Family Medicine

## 2013-04-05 DIAGNOSIS — D509 Iron deficiency anemia, unspecified: Secondary | ICD-10-CM

## 2013-04-05 MED ORDER — FERROUS SULFATE 324 (65 FE) MG PO TBEC: 1.0000 | DELAYED_RELEASE_TABLET | Freq: Two times a day (BID) | ORAL | Status: DC

## 2013-04-05 NOTE — Assessment & Plan Note (Signed)
A: IDA P:  Replete  with oral  iron

## 2013-04-07 ENCOUNTER — Encounter: Payer: Self-pay | Admitting: *Deleted

## 2013-11-22 ENCOUNTER — Telehealth: Payer: Self-pay | Admitting: Family Medicine

## 2013-11-22 NOTE — Telephone Encounter (Signed)
Patient dropped off physical form to be filled out.  Please call when completed.

## 2013-11-23 NOTE — Telephone Encounter (Signed)
LMOVM for pt to return call.  She will need an appt because she will need a TB test and also have some bloodwork to see if she is immune to certain diseases (MMR and Hep B) as we do not have a complete immunization record for her. West, Kaitlyn Spotted

## 2013-12-06 ENCOUNTER — Telehealth: Payer: Self-pay | Admitting: Family Medicine

## 2013-12-06 DIAGNOSIS — Z0184 Encounter for antibody response examination: Secondary | ICD-10-CM

## 2013-12-06 NOTE — Telephone Encounter (Signed)
Pt called back to see when we were going to do the blood test to see what shots she has had already. She would like someone to call her and let her know what she needs to do. jw

## 2013-12-06 NOTE — Telephone Encounter (Signed)
Titers ordered

## 2013-12-06 NOTE — Telephone Encounter (Signed)
Pt form is completed, we are just waiting on titiers to be drawn.  Will forward to MD to place orders. Ibeth Fahmy, Salome Spotted

## 2013-12-07 ENCOUNTER — Other Ambulatory Visit: Payer: Self-pay

## 2013-12-07 NOTE — Telephone Encounter (Signed)
Pt came on 12-07-13 for labs to be collected.  Unk Lightning, MLS

## 2013-12-07 NOTE — Progress Notes (Signed)
Drew labs and sent to [facility]:  MMR and Hep B Surface Antibody,  BAJORDAN, MLS

## 2013-12-08 LAB — HEPATITIS B SURFACE ANTIBODY,QUALITATIVE: Hep B S Ab: NEGATIVE

## 2013-12-08 LAB — MEASLES/MUMPS/RUBELLA IMMUNITY
MUMPS IGG: 5.73 [AU]/ml (ref <9.00)
RUBELLA: 2.35 {index} — AB (ref <0.90)
RUBEOLA IGG: 122 [AU]/ml — AB (ref <25.00)

## 2013-12-08 NOTE — Telephone Encounter (Signed)
Spoke with patient and informed that she will need a MMR and Hep B series.  Informed her that to receive them here with no insurance it would be $259, including admin of immunization ($145 for hep b and mmr $114).  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  She will call us back and let us know wether she will get immunizations here or @ [facility].  Completed form is in blue folder on blue hall. Fleeger, Salome Spotted

## 2013-12-13 ENCOUNTER — Encounter (HOSPITAL_COMMUNITY): Payer: Self-pay | Admitting: Emergency Medicine

## 2013-12-13 ENCOUNTER — Emergency Department (HOSPITAL_COMMUNITY)
Admission: EM | Admit: 2013-12-13 | Discharge: 2013-12-13 | Disposition: A | Discharge status: Home / Self Care | Payer: Self-pay | Attending: Emergency Medicine | Admitting: Emergency Medicine

## 2013-12-13 DIAGNOSIS — F17200 Nicotine dependence, unspecified, uncomplicated: Secondary | ICD-10-CM | POA: Insufficient documentation

## 2013-12-13 DIAGNOSIS — Z872 Personal history of diseases of the skin and subcutaneous tissue: Secondary | ICD-10-CM | POA: Insufficient documentation

## 2013-12-13 DIAGNOSIS — R002 Palpitations: Secondary | ICD-10-CM | POA: Insufficient documentation

## 2013-12-13 DIAGNOSIS — Z862 Personal history of diseases of the blood and blood-forming organs and certain disorders involving the immune mechanism: Secondary | ICD-10-CM | POA: Insufficient documentation

## 2013-12-13 DIAGNOSIS — R251 Tremor, unspecified: Secondary | ICD-10-CM

## 2013-12-13 DIAGNOSIS — R259 Unspecified abnormal involuntary movements: Secondary | ICD-10-CM | POA: Insufficient documentation

## 2013-12-13 LAB — COMPREHENSIVE METABOLIC PANEL
ALBUMIN: 3.8 g/dL (ref 3.5–5.2)
ALK PHOS: 80 U/L (ref 39–117)
ALT: 21 U/L (ref 0–35)
AST: 24 U/L (ref 0–37)
BUN: 12 mg/dL (ref 6–23)
CHLORIDE: 101 meq/L (ref 96–112)
CO2: 25 mEq/L (ref 19–32)
Calcium: 9 mg/dL (ref 8.4–10.5)
Creatinine, Ser: 0.8 mg/dL (ref 0.50–1.10)
GFR calc Af Amer: >90 mL/min (ref >90)
GFR calc non Af Amer: >90 mL/min (ref >90)
Glucose, Bld: 73 mg/dL (ref 70–99)
POTASSIUM: 3.8 meq/L (ref 3.7–5.3)
Sodium: 139 mEq/L (ref 137–147)
Total Bilirubin: 0.4 mg/dL (ref 0.3–1.2)
Total Protein: 7.7 g/dL (ref 6.0–8.3)

## 2013-12-13 LAB — CBC
HCT: 33.9 % — ABNORMAL LOW (ref 36.0–46.0)
HEMOGLOBIN: 10.3 g/dL — AB (ref 12.0–15.0)
MCH: 26.5 pg (ref 26.0–34.0)
MCHC: 30.4 g/dL (ref 30.0–36.0)
MCV: 87.1 fL (ref 78.0–100.0)
Platelets: 400 10*3/uL (ref 150–400)
RBC: 3.89 MIL/uL (ref 3.87–5.11)
RDW: 16.2 % — AB (ref 11.5–15.5)
WBC: 5.1 10*3/uL (ref 4.0–10.5)

## 2013-12-13 NOTE — ED Notes (Signed)
Pt reports left arm "shaking" since Saturday, sts feels uncomfortable. Pt also reports having occasional palpitations, sts none today but had some last night. Pt appears in NAD in triage  VSS

## 2013-12-13 NOTE — Progress Notes (Signed)
[facility] provided pt with a list of primary care resources and a [facility] [place] Card application to help patient establish primary care.  °

## 2013-12-13 NOTE — Discharge Instructions (Signed)
Palpitations   A palpitation is the feeling that your heartbeat is irregular or is faster than normal. It may feel like your heart is fluttering or skipping a beat. Palpitations are usually not a serious problem. However, in some cases, you may need further medical evaluation.  CAUSES   Palpitations can be caused by:   Smoking.   Caffeine or other stimulants, such as diet pills or energy drinks.   Alcohol.   Stress and anxiety.   Strenuous physical activity.   Fatigue.   Certain medicines.   Heart disease, especially if you have a history of arrhythmias. This includes atrial fibrillation, atrial flutter, or supraventricular tachycardia.   An improperly working pacemaker or defibrillator.  DIAGNOSIS   To find the cause of your palpitations, your caregiver will take your history and perform a physical exam. Tests may also be done, including:   Electrocardiography (ECG). This test records the heart's electrical activity.   Cardiac monitoring. This allows your caregiver to monitor your heart rate and rhythm in real time.   Holter monitor. This is a portable device that records your heartbeat and can help diagnose heart arrhythmias. It allows your caregiver to track your heart activity for several days, if needed.   Stress tests by exercise or by giving medicine that makes the heart beat faster.  TREATMENT   Treatment of palpitations depends on the cause of your symptoms and can vary greatly. Most cases of palpitations do not require any treatment other than time, relaxation, and monitoring your symptoms. Other causes, such as atrial fibrillation, atrial flutter, or supraventricular tachycardia, usually require further treatment.  HOME CARE INSTRUCTIONS    Avoid:   Caffeinated coffee, tea, soft drinks, diet pills, and energy drinks.   Chocolate.   Alcohol.   Stop smoking if you smoke.   Reduce your stress and anxiety. Things that can help you relax include:   A method that measures bodily functions so  you can learn to control them (biofeedback).   Yoga.   Meditation.   Physical activity such as swimming, jogging, or walking.   Get plenty of rest and sleep.  SEEK MEDICAL CARE IF:    You continue to have a fast or irregular heartbeat beyond 24 hours.   Your palpitations occur more often.  SEEK IMMEDIATE MEDICAL CARE IF:   You develop chest pain or shortness of breath.   You have a severe headache.   You feel dizzy, or you faint.  MAKE SURE YOU:   Understand these instructions.   Will watch your condition.   Will get help right away if you are not doing well or get worse.  Document Released: 09/13/2000 Document Revised: 01/11/2013 Document Reviewed: 11/15/2011  ExitCare Patient Information 2014 [employer].

## 2013-12-18 NOTE — ED Provider Notes (Signed)
CSN: [id number]     Arrival date & time 12/13/13  3536 History   First MD Initiated Contact with Patient 12/13/13 1034     Chief Complaint  Patient presents with  . hand shaking   . Palpitations     HPI Pt reports occasional palpitations for several days. she denies chest pain shortness of breath.  No fevers or chills.  No cough or congestion. She denies syncope.  No lightheadedness. She also does report some occasional shaking of her left arm.  There is no bowel or bladder loss.  There is no history of seizures.  No recent brain injury or head trauma.  No fevers or chills.    Past Medical History  Diagnosis Date  . Acne 01/22/2011  . Anemia   . Headache    Past Surgical History  Procedure Laterality Date  . Cholecystectomy     Family History  Problem Relation Age of Onset  . Migraines Mother   . Migraines Brother   . Migraines Daughter    History  Substance Use Topics  . Smoking status: Current Every Day Smoker -- 0.50 packs/day    Types: Cigarettes  . Smokeless tobacco: Never Used  . Alcohol Use: No   OB History   Grav Para Term Preterm Abortions TAB SAB Ect Mult Living                 Review of Systems  All other systems reviewed and are negative.      Allergies  Shellfish allergy  Home Medications   Current Outpatient Rx  Name  Route  Sig  Dispense  Refill  . Aspirin-Acetaminophen-Caffeine 260-130-16 MG TABS   Oral   Take 1 packet by mouth every 6 (six) hours as needed (Pain).          BP 112/61  Pulse 78  Temp(Src) 98.4 F (36.9 C) (Oral)  Resp 16  SpO2 99%  LMP 11/23/2013 Physical Exam  Nursing note and vitals reviewed. Constitutional: She is oriented to person, place, and time. She appears well-developed and well-nourished. No distress.  HENT:  Head: Normocephalic and atraumatic.  Eyes: EOM are normal. Pupils are equal, round, and reactive to light.  Neck: Normal range of motion.  Cardiovascular: Normal rate, regular rhythm and normal  heart sounds.   Pulmonary/Chest: Effort normal and breath sounds normal.  Abdominal: Soft. She exhibits no distension. There is no tenderness.  Musculoskeletal: Normal range of motion.  Neurological: She is alert and oriented to person, place, and time.  5/5 strength in major muscle groups of  bilateral upper and lower extremities. Speech normal. No facial asymetry.   Skin: Skin is warm and dry.  Psychiatric: She has a normal mood and affect. Judgment normal.    ED Course  Procedures (including critical care time) Labs Review Labs Reviewed  CBC - Abnormal; Notable for the following:    Hemoglobin 10.3 (*)    HCT 33.9 (*)    RDW 16.2 (*)    All other components within normal limits  COMPREHENSIVE METABOLIC PANEL   Imaging Review No results found.   EKG Interpretation   Date/Time:  Monday December 13 2013 10:53:49 EDT Ventricular Rate:  81 PR Interval:  162 QRS Duration: 86 QT Interval:  362 QTC Calculation: 420 R Axis:   62 Text Interpretation:  Sinus rhythm No old tracing to compare Confirmed by  Beza Steppe  MD, Lennette Bihari ([id number]) on 12/13/2013 11:35:14 AM      MDM   Final diagnoses:  Palpitations  Tremor    Patient is overall well-appearing.  Discharged in good condition.  Doubt seizure.  Occasional PACs and PVCs noted on monitor.  Patient will minimize her caffeine intake.    Hoy Morn, MD 12/18/13 [phone]

## 2014-04-04 ENCOUNTER — Encounter (HOSPITAL_BASED_OUTPATIENT_CLINIC_OR_DEPARTMENT_OTHER): Payer: Self-pay | Admitting: Emergency Medicine

## 2014-04-04 ENCOUNTER — Emergency Department (HOSPITAL_BASED_OUTPATIENT_CLINIC_OR_DEPARTMENT_OTHER): Payer: Self-pay

## 2014-04-04 ENCOUNTER — Emergency Department (HOSPITAL_BASED_OUTPATIENT_CLINIC_OR_DEPARTMENT_OTHER)
Admission: EM | Admit: 2014-04-04 | Discharge: 2014-04-04 | Disposition: A | Discharge status: Home / Self Care | Payer: Self-pay | Attending: Emergency Medicine | Admitting: Emergency Medicine

## 2014-04-04 DIAGNOSIS — Z8719 Personal history of other diseases of the digestive system: Secondary | ICD-10-CM | POA: Insufficient documentation

## 2014-04-04 DIAGNOSIS — Z872 Personal history of diseases of the skin and subcutaneous tissue: Secondary | ICD-10-CM | POA: Insufficient documentation

## 2014-04-04 DIAGNOSIS — N390 Urinary tract infection, site not specified: Secondary | ICD-10-CM | POA: Insufficient documentation

## 2014-04-04 DIAGNOSIS — R17 Unspecified jaundice: Secondary | ICD-10-CM | POA: Insufficient documentation

## 2014-04-04 DIAGNOSIS — Z862 Personal history of diseases of the blood and blood-forming organs and certain disorders involving the immune mechanism: Secondary | ICD-10-CM | POA: Insufficient documentation

## 2014-04-04 DIAGNOSIS — K759 Inflammatory liver disease, unspecified: Secondary | ICD-10-CM | POA: Insufficient documentation

## 2014-04-04 DIAGNOSIS — Z3202 Encounter for pregnancy test, result negative: Secondary | ICD-10-CM | POA: Insufficient documentation

## 2014-04-04 DIAGNOSIS — F17200 Nicotine dependence, unspecified, uncomplicated: Secondary | ICD-10-CM | POA: Insufficient documentation

## 2014-04-04 DIAGNOSIS — Z8679 Personal history of other diseases of the circulatory system: Secondary | ICD-10-CM | POA: Insufficient documentation

## 2014-04-04 HISTORY — DX: Gastro-esophageal reflux disease without esophagitis: K21.9

## 2014-04-04 HISTORY — DX: Reserved for inherently not codable concepts without codable children: IMO0001

## 2014-04-04 HISTORY — DX: Cardiac arrhythmia, unspecified: I49.9

## 2014-04-04 LAB — COMPREHENSIVE METABOLIC PANEL
ALBUMIN: 4 g/dL (ref 3.5–5.2)
ALK PHOS: 352 U/L — AB (ref 39–117)
ALT: 499 U/L — ABNORMAL HIGH (ref 0–35)
ANION GAP: 14 (ref 5–15)
AST: 221 U/L — ABNORMAL HIGH (ref 0–37)
BILIRUBIN TOTAL: 3.9 mg/dL — AB (ref 0.3–1.2)
BUN: 10 mg/dL (ref 6–23)
CHLORIDE: 102 meq/L (ref 96–112)
CO2: 24 mEq/L (ref 19–32)
Calcium: 9.3 mg/dL (ref 8.4–10.5)
Creatinine, Ser: 0.7 mg/dL (ref 0.50–1.10)
GFR calc non Af Amer: >90 mL/min (ref >90)
GLUCOSE: 82 mg/dL (ref 70–99)
POTASSIUM: 3.7 meq/L (ref 3.7–5.3)
Sodium: 140 mEq/L (ref 137–147)
Total Protein: 7.9 g/dL (ref 6.0–8.3)

## 2014-04-04 LAB — CBC WITH DIFFERENTIAL/PLATELET
Basophils Absolute: 0 10*3/uL (ref 0.0–0.1)
Basophils Relative: 0 % (ref 0–1)
Eosinophils Absolute: 0.2 10*3/uL (ref 0.0–0.7)
Eosinophils Relative: 3 % (ref 0–5)
HCT: 29.1 % — ABNORMAL LOW (ref 36.0–46.0)
HEMOGLOBIN: 9.2 g/dL — AB (ref 12.0–15.0)
LYMPHS ABS: 1.9 10*3/uL (ref 0.7–4.0)
Lymphocytes Relative: 29 % (ref 12–46)
MCH: 25.7 pg — ABNORMAL LOW (ref 26.0–34.0)
MCHC: 31.6 g/dL (ref 30.0–36.0)
MCV: 81.3 fL (ref 78.0–100.0)
MONOS PCT: 11 % (ref 3–12)
Monocytes Absolute: 0.7 10*3/uL (ref 0.1–1.0)
NEUTROS ABS: 3.7 10*3/uL (ref 1.7–7.7)
NEUTROS PCT: 57 % (ref 43–77)
Platelets: 334 10*3/uL (ref 150–400)
RBC: 3.58 MIL/uL — AB (ref 3.87–5.11)
RDW: 17.4 % — ABNORMAL HIGH (ref 11.5–15.5)
WBC: 6.5 10*3/uL (ref 4.0–10.5)

## 2014-04-04 LAB — URINALYSIS, ROUTINE W REFLEX MICROSCOPIC
GLUCOSE, UA: NEGATIVE mg/dL
KETONES UR: 15 mg/dL — AB
Nitrite: POSITIVE — AB
PROTEIN: NEGATIVE mg/dL
Specific Gravity, Urine: 1.024 (ref 1.005–1.030)
Urobilinogen, UA: 1 mg/dL (ref 0.0–1.0)
pH: 5.5 (ref 5.0–8.0)

## 2014-04-04 LAB — LIPASE, BLOOD: Lipase: 29 U/L (ref 11–59)

## 2014-04-04 LAB — PREGNANCY, URINE: Preg Test, Ur: NEGATIVE

## 2014-04-04 LAB — PROTIME-INR
INR: 0.9 (ref 0.00–1.49)
Prothrombin Time: 12.2 seconds (ref 11.6–15.2)

## 2014-04-04 LAB — URINE MICROSCOPIC-ADD ON

## 2014-04-04 MED ORDER — SULFAMETHOXAZOLE-TRIMETHOPRIM 800-160 MG PO TABS: 1.0000 | ORAL_TABLET | Freq: Two times a day (BID) | ORAL | Status: AC

## 2014-04-04 NOTE — ED Provider Notes (Signed)
CSN: [id number]     Arrival date & time 04/04/14  1549 History   First MD Initiated Contact with Patient 04/04/14 1722     Chief Complaint  Patient presents with  . Eye Problem     (Consider location/radiation/quality/duration/timing/severity/associated sxs/prior Treatment) Patient is a 33 y.o. female presenting with eye pain. The history is provided by the patient. No language interpreter was used.  Eye Pain This is a new problem. The current episode started today. The problem occurs constantly. The problem has been unchanged. Associated symptoms include abdominal pain and nausea. Nothing aggravates the symptoms. She has tried nothing for the symptoms. The treatment provided mild relief.  Pt complains of urine being discolored and eyes looking yellow. Pt reports she tried doing a detox treatment with lemon and water but has continued to feel worse.    Past Medical History  Diagnosis Date  . Acne 01/22/2011  . Anemia   . Headache   . Reflux   . Irregular heart rhythm    Past Surgical History  Procedure Laterality Date  . Cholecystectomy    . Tubal ligation     Family History  Problem Relation Age of Onset  . Migraines Mother   . Migraines Brother   . Migraines Daughter    History  Substance Use Topics  . Smoking status: Current Every Day Smoker -- 0.50 packs/day    Types: Cigarettes  . Smokeless tobacco: Never Used  . Alcohol Use: No   OB History   Grav Para Term Preterm Abortions TAB SAB Ect Mult Living                 Review of Systems  Eyes: Positive for pain.  Gastrointestinal: Positive for nausea and abdominal pain.  All other systems reviewed and are negative.     Allergies  Shellfish allergy  Home Medications   Prior to Admission medications   Medication Sig Start Date End Date Taking? Authorizing Provider  Aspirin-Acetaminophen-Caffeine 260-130-16 MG TABS Take 1 packet by mouth every 6 (six) hours as needed (Pain).    Historical Provider, MD    Pulse 75  Temp(Src) 97.9 F (36.6 C) (Oral)  Resp 16  Ht 5' 1.5" (1.562 m)  Wt 155 lb (70.308 kg)  BMI 28.82 kg/m2  SpO2 100%  LMP 04/04/2014 Physical Exam  Nursing note and vitals reviewed. Constitutional: She is oriented to person, place, and time. She appears well-developed and well-nourished.  HENT:  Head: Normocephalic.  Right Ear: External ear normal.  Left Ear: External ear normal.  Nose: Nose normal.  Mouth/Throat: Oropharynx is clear and moist.  Eyes: EOM are normal. Pupils are equal, round, and reactive to light.  Neck: Normal range of motion.  Cardiovascular: Normal rate and normal heart sounds.   Pulmonary/Chest: Effort normal and breath sounds normal.  Abdominal: Soft. She exhibits no distension. There is tenderness.  Musculoskeletal: Normal range of motion.  Neurological: She is alert and oriented to person, place, and time.  Skin: Skin is warm.  Psychiatric: She has a normal mood and affect.    ED Course  Procedures (including critical care time) Labs Review Labs Reviewed  URINALYSIS, ROUTINE W REFLEX MICROSCOPIC - Abnormal; Notable for the following:    Color, Urine ORANGE (*)    APPearance CLOUDY (*)    Hgb urine dipstick LARGE (*)    Bilirubin Urine LARGE (*)    Ketones, ur 15 (*)    Nitrite POSITIVE (*)    Leukocytes, UA MODERATE (*)  All other components within normal limits  URINE MICROSCOPIC-ADD ON - Abnormal; Notable for the following:    Bacteria, UA MANY (*)    All other components within normal limits  PREGNANCY, URINE  CBC WITH DIFFERENTIAL  COMPREHENSIVE METABOLIC PANEL  LIPASE, BLOOD    Imaging Review No results found.   EKG Interpretation None      MDM   Final diagnoses:  Hepatitis  Jaundice  UTI (lower urinary tract infection)    Pt counseled on labs and ultrasound.   Pt has been seen by Family practice in the past.    Pt to schedule to see Family Practice Md for recheck on  Labs are pending for acute hepatitis.     Pt treated with Bactim for uti.      [place], PA-C 04/04/14 2302

## 2014-04-04 NOTE — ED Notes (Signed)
Pt. Reports she has yellow eyes and has abd. Pain.  Pt. Reports no vomiting.  Pt reports nausea and her urine being neon colored.

## 2014-04-04 NOTE — ED Provider Notes (Signed)
Patient noted yellow eyes onset today and the bag abdominal discomfort, with diminished appetite. No fever no vomiting no other complaint. On exam patient is alert nontoxic no distress HEENT exam sclera mildly icteric neck supple lungs clear auscultation abdomen nondistended normal active bowel sounds nontender. Suspect hepatitis  Kaitlyn Dakin, MD 04/04/14 2059

## 2014-04-05 LAB — HEPATITIS PANEL, ACUTE
HCV Ab: NEGATIVE
HEP A IGM: NONREACTIVE
Hep B C IgM: NONREACTIVE
Hepatitis B Surface Ag: NEGATIVE

## 2014-04-05 NOTE — ED Provider Notes (Signed)
Medical screening examination/treatment/procedure(s) were conducted as a shared visit with non-physician practitioner(s) and myself.  I personally evaluated the patient during the encounter.   EKG Interpretation None       Orlie Dakin, MD 04/05/14 0020

## 2014-04-06 ENCOUNTER — Ambulatory Visit (INDEPENDENT_AMBULATORY_CARE_PROVIDER_SITE_OTHER): Payer: Self-pay | Admitting: Family Medicine

## 2014-04-06 ENCOUNTER — Encounter: Payer: Self-pay | Admitting: Family Medicine

## 2014-04-06 VITALS — BP 105/69 | HR 95 | Temp 99.1°F | Wt 149.0 lb

## 2014-04-06 DIAGNOSIS — R740 Nonspecific elevation of levels of transaminase and lactic acid dehydrogenase [LDH]: Principal | ICD-10-CM

## 2014-04-06 DIAGNOSIS — R7401 Elevation of levels of liver transaminase levels: Secondary | ICD-10-CM | POA: Insufficient documentation

## 2014-04-06 DIAGNOSIS — R7402 Elevation of levels of lactic acid dehydrogenase (LDH): Secondary | ICD-10-CM

## 2014-04-06 NOTE — Progress Notes (Signed)
   Subjective:    Patient ID: Kaitlyn West, female    DOB: [birth year], 33 y.o.   MRN: [id number]  HPI  Kaitlyn West is here for ED f/u.   Patient was seen in the ED for jaundice.  The jaundice started on Sunday. She was found to have elevated liver enzymes but hepatitis panel resulted negative. She denies any overall pruritis. She denies using alcohol or illicit drugs. She has not been outside the [place]. She denies fever, nausea, vomiting. She has one bowel movement per week. She is found to have urinary tract infection is currently taking antibiotics. She has regular headaches for which she takes Goody's powder on a daily basis. She takes 4-6 pills every day for the past 9 years. She has a history of a cholecystectomy. 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 Her husband and mother feel like her eyes are less yellow but the skin has no change.  She does not take any other medications on a regular basis other than the Goody's powder. Her last dose of the Goody's powder was on Monday. She still complains of weakness and fatigue.   Current Outpatient Prescriptions on File Prior to Visit  Medication Sig Dispense Refill  . sulfamethoxazole-trimethoprim (BACTRIM DS,SEPTRA DS) 800-160 MG per tablet Take 1 tablet by mouth 2 (two) times daily.  14 tablet  0  . Aspirin-Acetaminophen-Caffeine 260-130-16 MG TABS Take 1 packet by mouth every 6 (six) hours as needed (Pain).       No current facility-administered medications on file prior to visit.    Review of Systems See HPI     Objective:   Physical Exam BP 105/69  Pulse 95  Temp(Src) 99.1 F (37.3 C) (Oral)  Wt 149 lb (67.586 kg)  LMP 04/04/2014 Gen: NAD, alert, cooperative with exam, well-appearing HEENT: NCAT, scleral icterus Abd: SNTND, BS present, no guarding or organomegaly Skin: no rashes, normal turgor, jaundice       Assessment & Plan:

## 2014-04-06 NOTE — Patient Instructions (Addendum)
Thank you for coming in,   Please stop taking the goody's powder.   Our game plan will be determined by the results from today. I will call you with the results.   Try taking miralax powder on a daily basis. You can start with 1/2 a capful and increase until you have a bowel movement daily or every other day.   Please try to eat of a more balanced diet with fiber.   Please follow up with Dr. McDiarmid for your headache issues.    Please feel free to call with any questions or concerns at any time, at [phone]. --Dr. Raeford Razor

## 2014-04-06 NOTE — Assessment & Plan Note (Signed)
Evaluated in the ED on 7/6. Hepatic panel came back negative. Ultrasound to complete abdomen shows steatosis. Liver enzymes were elevated from baseline with ALT 499 and AST 221. Does take 46 pills of Goody's powder on a regular basis, it contains to 60 mg of Tylenol which should be below the toxic level. Denies drinking alcohol or using illicit drugs. Doesn't take iron. Possibility of been related to nonalcoholic fatty liver disease the patient was BMI of 27 although ultrasound did show steatosis. - CMP, lipid panel, Iron & TIBC, Ferritin  - Based on the liver enzymes will determine the followup - if trending up--> will obtain alpha-1 antitrypsin, SPEP, ceruplasmin - if trending down--> will follow along for 4 weeks.  - if not resolved in 4 weeks-->referral to GI.  - Discussed with Dr. Andria Frames.

## 2014-04-07 LAB — IRON AND TIBC
%SAT: 7 % — ABNORMAL LOW (ref 20–55)
IRON: 29 ug/dL — AB (ref 42–145)
TIBC: 437 ug/dL (ref 250–470)
UIBC: 408 ug/dL — ABNORMAL HIGH (ref 125–400)

## 2014-04-07 LAB — COMPREHENSIVE METABOLIC PANEL
ALT: 333 U/L — AB (ref 0–35)
AST: 119 U/L — ABNORMAL HIGH (ref 0–37)
Albumin: 4.1 g/dL (ref 3.5–5.2)
Alkaline Phosphatase: 318 U/L — ABNORMAL HIGH (ref 39–117)
BUN: 7 mg/dL (ref 6–23)
CO2: 25 meq/L (ref 19–32)
CREATININE: 0.64 mg/dL (ref 0.50–1.10)
Calcium: 9.1 mg/dL (ref 8.4–10.5)
Chloride: 104 mEq/L (ref 96–112)
Glucose, Bld: 92 mg/dL (ref 70–99)
Potassium: 4.2 mEq/L (ref 3.5–5.3)
Sodium: 138 mEq/L (ref 135–145)
Total Bilirubin: 1.7 mg/dL — ABNORMAL HIGH (ref 0.2–1.2)
Total Protein: 7.4 g/dL (ref 6.0–8.3)

## 2014-04-07 LAB — LIPID PANEL
CHOL/HDL RATIO: 4.9 ratio
CHOLESTEROL: 157 mg/dL (ref 0–200)
HDL: 32 mg/dL — AB (ref >39)
LDL Cholesterol: 111 mg/dL — ABNORMAL HIGH (ref 0–99)
Triglycerides: 71 mg/dL (ref <150)
VLDL: 14 mg/dL (ref 0–40)

## 2014-04-07 LAB — FERRITIN: Ferritin: 4 ng/mL — ABNORMAL LOW (ref 10–291)

## 2014-04-08 ENCOUNTER — Telehealth: Payer: Self-pay | Admitting: *Deleted

## 2014-04-08 NOTE — Telephone Encounter (Signed)
Attempted to call patient, but phone is not taking phone calls at this time

## 2014-04-08 NOTE — Telephone Encounter (Signed)
Message copied by Johny Shears on Fri Apr 08, 2014  3:19 PM ------      Message from: Rosemarie Ax      Created: Fri Apr 08, 2014  2:39 PM       Please call patient and inform that her liver studies are trending down. Follow up in one month unless her jaundice gets worse again. Thank you. ------

## 2014-04-14 ENCOUNTER — Telehealth: Payer: Self-pay | Admitting: Family Medicine

## 2014-04-14 NOTE — Telephone Encounter (Signed)
Patient requesting labs results from [facility] 04/06/14 with Raeford Razor.

## 2014-04-14 NOTE — Telephone Encounter (Signed)
Patient liver studies are trending down. Have tried to call her but VM unable to take calls. Follow up in one month.   Rosemarie Ax, MD PGY-2, [facility] 04/14/2014, 2:15 PM

## 2014-04-14 NOTE — Telephone Encounter (Signed)
Please advise.Thank you.Kaitlyn West  

## 2014-04-15 NOTE — Telephone Encounter (Signed)
Spoke with patient and I have made an appointment for her on 05/11/14 @ 8:30am

## 2014-04-15 NOTE — Telephone Encounter (Signed)
LVM for patient to call back. ?

## 2014-05-04 ENCOUNTER — Ambulatory Visit: Payer: Self-pay

## 2014-05-11 ENCOUNTER — Ambulatory Visit: Payer: Self-pay | Admitting: Family Medicine

## 2014-12-20 ENCOUNTER — Emergency Department (HOSPITAL_COMMUNITY): Payer: Self-pay

## 2014-12-20 ENCOUNTER — Encounter (HOSPITAL_COMMUNITY): Payer: Self-pay | Admitting: Neurology

## 2014-12-20 ENCOUNTER — Emergency Department (HOSPITAL_COMMUNITY)
Admission: EM | Admit: 2014-12-20 | Discharge: 2014-12-20 | Disposition: A | Discharge status: Home / Self Care | Payer: Self-pay | Attending: Emergency Medicine | Admitting: Emergency Medicine

## 2014-12-20 DIAGNOSIS — Z720 Tobacco use: Secondary | ICD-10-CM | POA: Insufficient documentation

## 2014-12-20 DIAGNOSIS — R05 Cough: Secondary | ICD-10-CM

## 2014-12-20 DIAGNOSIS — Z8719 Personal history of other diseases of the digestive system: Secondary | ICD-10-CM | POA: Insufficient documentation

## 2014-12-20 DIAGNOSIS — Z862 Personal history of diseases of the blood and blood-forming organs and certain disorders involving the immune mechanism: Secondary | ICD-10-CM | POA: Insufficient documentation

## 2014-12-20 DIAGNOSIS — J0110 Acute frontal sinusitis, unspecified: Secondary | ICD-10-CM | POA: Insufficient documentation

## 2014-12-20 DIAGNOSIS — H9209 Otalgia, unspecified ear: Secondary | ICD-10-CM | POA: Insufficient documentation

## 2014-12-20 DIAGNOSIS — R059 Cough, unspecified: Secondary | ICD-10-CM

## 2014-12-20 DIAGNOSIS — Z872 Personal history of diseases of the skin and subcutaneous tissue: Secondary | ICD-10-CM | POA: Insufficient documentation

## 2014-12-20 DIAGNOSIS — Z8679 Personal history of other diseases of the circulatory system: Secondary | ICD-10-CM | POA: Insufficient documentation

## 2014-12-20 DIAGNOSIS — M542 Cervicalgia: Secondary | ICD-10-CM | POA: Insufficient documentation

## 2014-12-20 MED ORDER — FLUCONAZOLE 150 MG PO TABS: 150.0000 mg | ORAL_TABLET | Freq: Once | ORAL | Status: DC

## 2014-12-20 MED ORDER — AMOXICILLIN 500 MG PO CAPS: 500.0000 mg | ORAL_CAPSULE | Freq: Two times a day (BID) | ORAL | Status: DC

## 2014-12-20 NOTE — ED Provider Notes (Signed)
CSN: [id number]     Arrival date & time 12/20/14  9326 History  This chart was scribed for non-physician practitioner Alvina Chou, working with Jola Schmidt, MD by Donato Schultz, ED Scribe. This patient was seen in room TR06C/TR06C and the patient's care was started at 11:08 AM.   Chief Complaint  Patient presents with  . URI   Patient is a 34 y.o. female presenting with URI. The history is provided by the patient. No language interpreter was used.  URI Presenting symptoms: congestion, cough and ear pain   Associated symptoms: headaches, neck pain and sneezing    HPI Comments: Kaitlyn West is a 34 y.o. female with a history of seasonal allergies who presents to the Emergency Department complaining of constant cough productive of yellow sputum, sneezing, and nasal congestion that started 2 weeks ago.  She has taken Zyrtec, Mucinex, Aleve, and OTC allergy medication with no relief to her symptoms.  She denies sick contacts.  She lists headache, ear pain, and neck pain as associated symptoms.  She denies sinus pressure as an associated symptom.   Past Medical History  Diagnosis Date  . Acne 01/22/2011  . Anemia   . Headache   . Reflux   . Irregular heart rhythm    Past Surgical History  Procedure Laterality Date  . Cholecystectomy    . Tubal ligation     Family History  Problem Relation Age of Onset  . Migraines Mother   . Migraines Brother   . Migraines Daughter    History  Substance Use Topics  . Smoking status: Current Every Day Smoker -- 0.50 packs/day    Types: Cigarettes  . Smokeless tobacco: Never Used  . Alcohol Use: No   OB History    No data available     Review of Systems  HENT: Positive for congestion, ear pain and sneezing. Negative for sinus pressure.   Respiratory: Positive for cough.   Musculoskeletal: Positive for neck pain.  Neurological: Positive for headaches.  All other systems reviewed and are negative.     Allergies  Shellfish  allergy  Home Medications   Prior to Admission medications   Medication Sig Start Date End Date Taking? Authorizing Provider  Aspirin-Acetaminophen-Caffeine 260-130-16 MG TABS Take 1 packet by mouth every 6 (six) hours as needed (Pain).    Historical Provider, MD   Triage Vitals: BP 139/64 mmHg  Pulse 117  Temp(Src) 98.5 F (36.9 C) (Oral)  Resp 20  SpO2 100%  LMP 11/21/2014  Physical Exam  Constitutional: She is oriented to person, place, and time. She appears well-developed and well-nourished. No distress.  HENT:  Head: Normocephalic and atraumatic.  Mouth/Throat: Oropharynx is clear and moist. No oropharyngeal exudate.  Frontal sinus tenderness to palpation.   Eyes: Conjunctivae and EOM are normal.  Neck: Normal range of motion.  Cardiovascular: Normal rate and regular rhythm.  Exam reveals no gallop and no friction rub.   No murmur heard. Pulmonary/Chest: Effort normal and breath sounds normal. She has no wheezes. She has no rales. She exhibits no tenderness.  Abdominal: Soft. There is no tenderness.  Musculoskeletal: Normal range of motion.  Lymphadenopathy:    She has cervical adenopathy.  Neurological: She is alert and oriented to person, place, and time.  Speech is goal-oriented. Moves limbs without ataxia.   Skin: Skin is warm and dry.  Psychiatric: She has a normal mood and affect. Her behavior is normal.  Nursing note and vitals reviewed.   ED Course  Procedures (including critical care time)  DIAGNOSTIC STUDIES: Oxygen Saturation is 100% on room air, normal by my interpretation.    COORDINATION OF CARE: 11:11 AM- Will discharge the patient with antibiotics to treat sinusitis.  The patient agreed to the treatment plan.  Labs Review Labs Reviewed - No data to display  Imaging Review Dg Chest 2 View  12/20/2014   CLINICAL DATA:  Persistent cough.  Lymphadenopathy.  EXAM: CHEST  2 VIEW  COMPARISON:  None.  FINDINGS: The heart size and mediastinal contours  are within normal limits. Both lungs are clear. The visualized skeletal structures are unremarkable.  IMPRESSION: Normal chest.   Electronically Signed   By: Lorriane Shire M.D.   On: 12/20/2014 10:43     EKG Interpretation None      MDM   Final diagnoses:  Cough  Acute frontal sinusitis, recurrence not specified   11:18 AM Patient likely has sinusitis and will be treated with amoxicillin. Vitals stable and patient afebrile. No other signs of bacterial infection.   I personally performed the services described in this documentation, which was scribed in my presence. The recorded information has been reviewed and is accurate.    Alvina Chou, PA-C 12/20/14 [place], MD 12/20/14 1158

## 2014-12-20 NOTE — Discharge Instructions (Signed)
Take amoxicillin as directed until gone. Refer to attached documents for more information. Return to the ED with worsening or concerning symptoms.

## 2014-12-20 NOTE — ED Notes (Signed)
Pt reports cough, nasal congestion for 2 weeks.

## 2015-07-10 ENCOUNTER — Encounter (HOSPITAL_COMMUNITY): Payer: Self-pay | Admitting: Emergency Medicine

## 2015-07-10 ENCOUNTER — Emergency Department (HOSPITAL_COMMUNITY): Admission: EM | Admit: 2015-07-10 | Discharge: 2015-07-10 | Discharge status: Absent without leave | Payer: MEDICAID

## 2015-07-10 ENCOUNTER — Emergency Department (HOSPITAL_COMMUNITY)
Admission: EM | Admit: 2015-07-10 | Discharge: 2015-07-10 | Disposition: A | Discharge status: Home / Self Care | Payer: Self-pay | Attending: Emergency Medicine | Admitting: Emergency Medicine

## 2015-07-10 DIAGNOSIS — Z8719 Personal history of other diseases of the digestive system: Secondary | ICD-10-CM | POA: Insufficient documentation

## 2015-07-10 DIAGNOSIS — B349 Viral infection, unspecified: Secondary | ICD-10-CM | POA: Insufficient documentation

## 2015-07-10 DIAGNOSIS — Z862 Personal history of diseases of the blood and blood-forming organs and certain disorders involving the immune mechanism: Secondary | ICD-10-CM | POA: Insufficient documentation

## 2015-07-10 DIAGNOSIS — Z792 Long term (current) use of antibiotics: Secondary | ICD-10-CM | POA: Insufficient documentation

## 2015-07-10 DIAGNOSIS — Z8679 Personal history of other diseases of the circulatory system: Secondary | ICD-10-CM | POA: Insufficient documentation

## 2015-07-10 DIAGNOSIS — Z872 Personal history of diseases of the skin and subcutaneous tissue: Secondary | ICD-10-CM | POA: Insufficient documentation

## 2015-07-10 DIAGNOSIS — H9202 Otalgia, left ear: Secondary | ICD-10-CM | POA: Insufficient documentation

## 2015-07-10 DIAGNOSIS — Z720 Tobacco use: Secondary | ICD-10-CM | POA: Insufficient documentation

## 2015-07-10 LAB — RAPID STREP SCREEN (MED CTR MEBANE ONLY): STREPTOCOCCUS, GROUP A SCREEN (DIRECT): NEGATIVE

## 2015-07-10 NOTE — ED Provider Notes (Signed)
CSN: [id number]     Arrival date & time 07/10/15  1824 History  By signing my name below, I, [name], attest that this documentation has been prepared under the direction and in the presence of Junius Creamer, NP. Electronically Signed: [name], ED Scribe. 07/10/2015. 8:31 PM.    Chief Complaint  Patient presents with  . Sore Throat   The history is provided by the patient. No language interpreter was used.    HPI Comments: Kaitlyn West is a 34 y.o. female who presents to the Emergency Department complaining of a gradual-onset, moderate, sore throat and left otalgia since last night. Patient has not taken any treatments for pain. She denies rhinorrhea or congestion.   Past Medical History  Diagnosis Date  . Acne 01/22/2011  . Anemia   . Headache   . Reflux   . Irregular heart rhythm    Past Surgical History  Procedure Laterality Date  . Cholecystectomy    . Tubal ligation     Family History  Problem Relation Age of Onset  . Migraines Mother   . Migraines Brother   . Migraines Daughter    Social History  Substance Use Topics  . Smoking status: Current Every Day Smoker -- 0.50 packs/day    Types: Cigarettes  . Smokeless tobacco: Never Used  . Alcohol Use: No   OB History    No data available     Review of Systems  HENT: Positive for ear pain and sore throat. Negative for congestion and rhinorrhea.   All other systems reviewed and are negative.  Allergies  Shellfish allergy  Home Medications   Prior to Admission medications   Medication Sig Start Date End Date Taking? Authorizing Provider  amoxicillin (AMOXIL) 500 MG capsule Take 1 capsule (500 mg total) by mouth 2 (two) times daily. 12/20/14   Alvina Chou, PA-C  Aspirin-Acetaminophen-Caffeine 260-130-16 MG TABS Take 1 packet by mouth every 6 (six) hours as needed (Pain).    Historical Provider, MD  fluconazole (DIFLUCAN) 150 MG tablet Take 1 tablet (150 mg total) by mouth once. 12/20/14   Kaitlyn  Szekalski, PA-C   BP 102/65 mmHg  Pulse 84  Temp(Src) 98.1 F (36.7 C) (Oral)  Resp 16  SpO2 100%  LMP 06/28/2015 Physical Exam  Constitutional: She is oriented to person, place, and time. She appears well-developed and well-nourished. No distress.  HENT:  Head: Normocephalic and atraumatic.  Eyes: Conjunctivae and EOM are normal.  Neck: Neck supple. No tracheal deviation present.  Cardiovascular: Normal rate.   Pulmonary/Chest: Effort normal. No respiratory distress.  Musculoskeletal: Normal range of motion.  Neurological: She is alert and oriented to person, place, and time.  Skin: Skin is warm and dry.  Psychiatric: She has a normal mood and affect. Her behavior is normal.  Nursing note and vitals reviewed.   ED Course  Procedures (including critical care time)  DIAGNOSTIC STUDIES: Oxygen Saturation is 100% on RA, normal by my interpretation.    COORDINATION OF CARE: 8:26 PM - Pt made aware of negative strep screen. Suspect viral infection. Discussed treatment plan with pt at bedside which includes Tylenol and Motrin for symptomatic control. Pt verbalized understanding and agreed to plan.   Labs Review Labs Reviewed  RAPID STREP SCREEN (NOT AT [facility])  CULTURE, GROUP A STREP   MDM   Final diagnoses:  Viral syndrome    I personally performed the services described in this documentation, which was scribed in my presence. The recorded information  has been reviewed and is accurate.    Junius Creamer, NP 07/11/15 [id number]  Charlesetta Shanks, MD 07/13/15 [phone]

## 2015-07-10 NOTE — ED Notes (Signed)
Per pt, states throat pain and left ear pain since last night

## 2015-07-10 NOTE — Discharge Instructions (Signed)
Your strep test is negative. °

## 2015-07-13 LAB — CULTURE, GROUP A STREP: Strep A Culture: NEGATIVE

## 2016-01-28 ENCOUNTER — Encounter (HOSPITAL_COMMUNITY): Payer: Self-pay | Admitting: Emergency Medicine

## 2016-01-28 ENCOUNTER — Emergency Department (HOSPITAL_COMMUNITY): Payer: No Typology Code available for payment source

## 2016-01-28 ENCOUNTER — Emergency Department (HOSPITAL_COMMUNITY)
Admission: EM | Admit: 2016-01-28 | Discharge: 2016-01-28 | Disposition: A | Discharge status: Home / Self Care | Payer: No Typology Code available for payment source | Attending: Emergency Medicine | Admitting: Emergency Medicine

## 2016-01-28 DIAGNOSIS — S3992XA Unspecified injury of lower back, initial encounter: Secondary | ICD-10-CM | POA: Diagnosis not present

## 2016-01-28 DIAGNOSIS — Z8679 Personal history of other diseases of the circulatory system: Secondary | ICD-10-CM | POA: Diagnosis not present

## 2016-01-28 DIAGNOSIS — M791 Myalgia, unspecified site: Secondary | ICD-10-CM

## 2016-01-28 DIAGNOSIS — Y998 Other external cause status: Secondary | ICD-10-CM | POA: Insufficient documentation

## 2016-01-28 DIAGNOSIS — Z8719 Personal history of other diseases of the digestive system: Secondary | ICD-10-CM | POA: Insufficient documentation

## 2016-01-28 DIAGNOSIS — Y92410 Unspecified street and highway as the place of occurrence of the external cause: Secondary | ICD-10-CM | POA: Insufficient documentation

## 2016-01-28 DIAGNOSIS — Z862 Personal history of diseases of the blood and blood-forming organs and certain disorders involving the immune mechanism: Secondary | ICD-10-CM | POA: Insufficient documentation

## 2016-01-28 DIAGNOSIS — Z792 Long term (current) use of antibiotics: Secondary | ICD-10-CM | POA: Diagnosis not present

## 2016-01-28 DIAGNOSIS — Y9389 Activity, other specified: Secondary | ICD-10-CM | POA: Insufficient documentation

## 2016-01-28 DIAGNOSIS — S199XXA Unspecified injury of neck, initial encounter: Secondary | ICD-10-CM | POA: Insufficient documentation

## 2016-01-28 DIAGNOSIS — M542 Cervicalgia: Secondary | ICD-10-CM

## 2016-01-28 DIAGNOSIS — S0990XA Unspecified injury of head, initial encounter: Secondary | ICD-10-CM | POA: Insufficient documentation

## 2016-01-28 DIAGNOSIS — F17210 Nicotine dependence, cigarettes, uncomplicated: Secondary | ICD-10-CM | POA: Insufficient documentation

## 2016-01-28 DIAGNOSIS — Z872 Personal history of diseases of the skin and subcutaneous tissue: Secondary | ICD-10-CM | POA: Diagnosis not present

## 2016-01-28 MED ORDER — METHOCARBAMOL 500 MG PO TABS: 500.0000 mg | ORAL_TABLET | Freq: Two times a day (BID) | ORAL | Status: DC
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
## 2016-06-24 ENCOUNTER — Other Ambulatory Visit (HOSPITAL_COMMUNITY)
Admission: RE | Admit: 2016-06-24 | Discharge: 2016-06-24 | Disposition: A | Discharge status: Home / Self Care | Payer: Self-pay | Source: Ambulatory Visit | Attending: Family Medicine | Admitting: Family Medicine

## 2016-06-24 ENCOUNTER — Encounter: Payer: Self-pay | Admitting: Student in an Organized Health Care Education/Training Program

## 2016-06-24 ENCOUNTER — Ambulatory Visit (INDEPENDENT_AMBULATORY_CARE_PROVIDER_SITE_OTHER): Payer: Medicaid Other | Admitting: Student in an Organized Health Care Education/Training Program

## 2016-06-24 VITALS — BP 113/68 | HR 67 | Temp 98.0°F | Ht 61.0 in | Wt 138.4 lb

## 2016-06-24 DIAGNOSIS — Z01419 Encounter for gynecological examination (general) (routine) without abnormal findings: Secondary | ICD-10-CM | POA: Insufficient documentation

## 2016-06-24 DIAGNOSIS — Z0000 Encounter for general adult medical examination without abnormal findings: Secondary | ICD-10-CM | POA: Diagnosis not present

## 2016-06-24 DIAGNOSIS — Z113 Encounter for screening for infections with a predominantly sexual mode of transmission: Secondary | ICD-10-CM | POA: Insufficient documentation

## 2016-06-24 DIAGNOSIS — F17200 Nicotine dependence, unspecified, uncomplicated: Secondary | ICD-10-CM | POA: Diagnosis not present

## 2016-06-24 DIAGNOSIS — Z1151 Encounter for screening for human papillomavirus (HPV): Secondary | ICD-10-CM | POA: Insufficient documentation

## 2016-06-24 DIAGNOSIS — Z23 Encounter for immunization: Secondary | ICD-10-CM | POA: Diagnosis not present

## 2016-06-24 NOTE — Assessment & Plan Note (Signed)
Overall, well woman without abnormal findings on exam. - Pap smear today - GC/ chlamydia/ hep B and C / HIV tests at patient's request for STI screening - will follow up results over the phone with the patient

## 2016-06-24 NOTE — Assessment & Plan Note (Addendum)
Strategies for cutting down cigarette use were discussed.  Patient will consider spacing out cigarettes when she craves them.  Has tried chantix in the past but feels it made her stomach upset. - referred to quit line phone and website

## 2016-06-24 NOTE — Patient Instructions (Addendum)
It was a pleasure seeing you today in our clinic. Today we discussed your health. Here is the treatment plan we have discussed and agreed upon together:  We talked about strategies for cutting down on smoking cigarettes.  This is the single most effective thing you can do to positively influence your future health. For smoking cessation, please check out these resources: 1-800 QUIT NOW Quitline[place].com  We also did some screening tests and you received your PAP smear.  I will give you a call when these results are available.  For constipation, please consider using Colace which is available over the counter at the pharmacy.  Our clinic's number is [phone]. Please call with questions or concerns about what we discussed today.  - Dr. Burr Medico

## 2016-06-24 NOTE — Progress Notes (Signed)
     Brantley Dockter is a 35yo female who presents for annual routine Pap and checkup.  Patient has no significant past medical history other than a history of migraines which have not bothered her recently, transaminitis two years ago thought to be secondary to tylenol over use, and tobacco abuse.  Overall feels well with some complaints of constipation that bothers her intermittently.  Patient endorses some interest in quitting tobacco use but is not interested in a designated smoking cessation office visit at this time. Smokes 1 pack every 2 days, 10 pack/year smoking history.    Otherwise patient states she wants PAP smear with STI screening. Agrees to one time HIV screening as well.   No current outpatient prescriptions on file.   No current facility-administered medications for this visit.    Allergies: Shellfish allergy  Patient's last menstrual period was 05/28/2016 (approximate).  ROS:  Feeling well. No dyspnea or chest pain on exertion.  No abdominal pain, change in bowel habits, black or bloody stools.  No urinary tract symptoms. GYN ROS: normal menses, no abnormal bleeding, pelvic pain or discharge. No new breast pain or enlarging lumps on self exam. No neurological complaints.  OBJECTIVE:  The patient appears well, alert, oriented x 3, in no distress. BP 113/68   Pulse 67   Temp 98 F (36.7 C) (Oral)   Ht 5\' 1"  (1.549 m)   Wt 138 lb 6.4 oz (62.8 kg)   LMP 05/28/2016 (Approximate)   BMI 26.15 kg/m  ENT: normal.  PERLA. MMM. No pharyngeal erythema or exudate. No conjunctival palor or injection Neck: supple. No adenopathy or thyromegaly.  Lungs: clear, good air entry, no wheezes, rhonchi or rales.  CV: S1 and S2 normal, no murmurs, regular rate and rhythm.  Abdomen: soft without tenderness, guarding, mass or organomegaly.  Extremities: no edema, normal peripheral pulses. Neurological is normal, no focal findings.  PELVIC EXAM: .Normal external genitalia, vulva, vagina,  cervix, uterus and adnexa.  +Bloody cervical discharge consistent with menses.    Assessment and plan:  SMOKER Strategies for cutting down cigarette use were discussed.  Patient will consider spacing out cigarettes when she craves them.  Has tried chantix in the past but feels it made her stomach upset. - referred to quit line phone and website  Health care maintenance Overall, well woman without abnormal findings on exam. - Pap smear today - GC/ chlamydia/ hep B and C / HIV tests at patient's request for STI screening - will follow up results over the phone with the patient   Orders Placed This Encounter  Procedures  . Flu Vaccine QUAD 36+ mos IM  . HIV antibody  . Acute Hep Panel & Hep B Surface Ab  . Hepatitis C Antibody      Everrett Coombe, MD,MS,  PGY1 06/24/2016 7:32 PM

## 2016-06-25 LAB — ACUTE HEP PANEL AND HEP B SURFACE AB
HCV AB: NEGATIVE
HEP B S AB: NEGATIVE
Hep A IgM: NONREACTIVE
Hep B C IgM: NONREACTIVE
Hepatitis B Surface Ag: NEGATIVE

## 2016-06-25 LAB — HIV ANTIBODY (ROUTINE TESTING W REFLEX): HIV 1&2 Ab, 4th Generation: NONREACTIVE

## 2016-06-26 ENCOUNTER — Telehealth: Payer: Self-pay | Admitting: Student in an Organized Health Care Education/Training Program

## 2016-06-26 LAB — CERVICOVAGINAL ANCILLARY ONLY
Chlamydia: NEGATIVE
NEISSERIA GONORRHEA: NEGATIVE

## 2016-06-26 LAB — CYTOLOGY - PAP

## 2016-06-26 NOTE — Telephone Encounter (Signed)
Spoke with patient on the phone to convey that her recent lab results were normal.  HIV, Hepatitis panel, Gonorrhea, Chlamydia all negative.  Patient voiced understanding.  Everrett Coombe, MD

## 2016-06-30 ENCOUNTER — Encounter: Payer: Self-pay | Admitting: Student in an Organized Health Care Education/Training Program

## 2016-07-02 ENCOUNTER — Encounter: Payer: Self-pay | Admitting: Student in an Organized Health Care Education/Training Program

## 2017-07-09 ENCOUNTER — Encounter: Payer: Self-pay | Admitting: Internal Medicine

## 2017-07-09 ENCOUNTER — Ambulatory Visit (INDEPENDENT_AMBULATORY_CARE_PROVIDER_SITE_OTHER): Payer: Medicaid Other | Admitting: Internal Medicine

## 2017-07-09 VITALS — BP 130/72 | HR 82 | Temp 98.8°F | Ht 61.0 in | Wt 131.8 lb

## 2017-07-09 DIAGNOSIS — R945 Abnormal results of liver function studies: Secondary | ICD-10-CM | POA: Diagnosis not present

## 2017-07-09 DIAGNOSIS — Z23 Encounter for immunization: Secondary | ICD-10-CM

## 2017-07-09 DIAGNOSIS — N898 Other specified noninflammatory disorders of vagina: Secondary | ICD-10-CM

## 2017-07-09 DIAGNOSIS — T7840XS Allergy, unspecified, sequela: Secondary | ICD-10-CM

## 2017-07-09 DIAGNOSIS — R21 Rash and other nonspecific skin eruption: Secondary | ICD-10-CM

## 2017-07-09 DIAGNOSIS — R7989 Other specified abnormal findings of blood chemistry: Secondary | ICD-10-CM

## 2017-07-09 LAB — POCT WET PREP (WET MOUNT)
Clue Cells Wet Prep Whiff POC: POSITIVE
Trichomonas Wet Prep HPF POC: ABSENT

## 2017-07-09 MED ORDER — RANITIDINE HCL 150 MG PO TABS: 150.0000 mg | ORAL_TABLET | Freq: Two times a day (BID) | ORAL | 1 refills | Status: DC

## 2017-07-09 MED ORDER — METRONIDAZOLE 500 MG PO TABS: 500.0000 mg | ORAL_TABLET | Freq: Two times a day (BID) | ORAL | 0 refills | Status: DC

## 2017-07-09 MED ORDER — FLUCONAZOLE 150 MG PO TABS: ORAL_TABLET | ORAL | 0 refills | Status: DC

## 2017-07-09 MED ORDER — CETIRIZINE HCL 10 MG PO TABS: 10.0000 mg | ORAL_TABLET | Freq: Every day | ORAL | 11 refills | Status: DC

## 2017-07-09 MED ORDER — CETIRIZINE HCL 10 MG PO TABS
10.0000 mg | ORAL_TABLET | Freq: Every day | ORAL | 11 refills | Status: DC
Start: 2017-07-09 — End: 2017-07-09

## 2017-07-09 NOTE — Patient Instructions (Signed)
Ms. Manganello,  I think you have itching from an over-eager response to allergies.  Please take zyrtec daily and zantac twice daily. Both of these should help with itching. Return in a couple weeks if no improvement.  I will call you with results of wet prep.  Best, Dr. Ola Spurr

## 2017-07-10 LAB — COMPREHENSIVE METABOLIC PANEL
A/G RATIO: 1.3 (ref 1.2–2.2)
ALK PHOS: 64 IU/L (ref 39–117)
ALT: 13 IU/L (ref 0–32)
AST: 17 IU/L (ref 0–40)
Albumin: 4.4 g/dL (ref 3.5–5.5)
BILIRUBIN TOTAL: 0.6 mg/dL (ref 0.0–1.2)
BUN / CREAT RATIO: 13 (ref 9–23)
BUN: 11 mg/dL (ref 6–20)
CHLORIDE: 103 mmol/L (ref 96–106)
CO2: 24 mmol/L (ref 20–29)
Calcium: 9.5 mg/dL (ref 8.7–10.2)
Creatinine, Ser: 0.82 mg/dL (ref 0.57–1.00)
GFR calc non Af Amer: 91 mL/min/{1.73_m2} (ref >59)
GFR, EST AFRICAN AMERICAN: 105 mL/min/{1.73_m2} (ref >59)
GLUCOSE: 86 mg/dL (ref 65–99)
Globulin, Total: 3.3 g/dL (ref 1.5–4.5)
POTASSIUM: 4.1 mmol/L (ref 3.5–5.2)
Sodium: 141 mmol/L (ref 134–144)
Total Protein: 7.7 g/dL (ref 6.0–8.5)

## 2017-07-10 LAB — CBC WITH DIFFERENTIAL
BASOS ABS: 0 10*3/uL (ref 0.0–0.2)
Basos: 0 %
EOS (ABSOLUTE): 0.3 10*3/uL (ref 0.0–0.4)
Eos: 4 %
Hematocrit: 30.9 % — ABNORMAL LOW (ref 34.0–46.6)
Hemoglobin: 9.2 g/dL — ABNORMAL LOW (ref 11.1–15.9)
IMMATURE GRANULOCYTES: 0 %
Immature Grans (Abs): 0 10*3/uL (ref 0.0–0.1)
LYMPHS ABS: 2.3 10*3/uL (ref 0.7–3.1)
Lymphs: 32 %
MCH: 23.5 pg — AB (ref 26.6–33.0)
MCHC: 29.8 g/dL — AB (ref 31.5–35.7)
MCV: 79 fL (ref 79–97)
MONOS ABS: 0.7 10*3/uL (ref 0.1–0.9)
Monocytes: 9 %
Neutrophils Absolute: 3.9 10*3/uL (ref 1.4–7.0)
Neutrophils: 55 %
RBC: 3.92 x10E6/uL (ref 3.77–5.28)
RDW: 18.5 % — AB (ref 12.3–15.4)
WBC: 7.2 10*3/uL (ref 3.4–10.8)

## 2017-07-11 DIAGNOSIS — N898 Other specified noninflammatory disorders of vagina: Secondary | ICD-10-CM | POA: Insufficient documentation

## 2017-07-11 DIAGNOSIS — R21 Rash and other nonspecific skin eruption: Secondary | ICD-10-CM | POA: Insufficient documentation

## 2017-07-11 NOTE — Assessment & Plan Note (Signed)
-   Suspect chronic urticaria vs physical urticaria (heat worsens). With history of elevated LFTs, also want to obtain LFTs. - Obtain CMP. Obtain CBC with diff, looking for eosinophilia. - Trial daily zyrtec and BID zantac for symptomatic management.

## 2017-07-11 NOTE — Assessment & Plan Note (Signed)
-   Wet prep shows BV. - Ordered metronidazole. Also provided rx for diflucan in case patient develops yeast infection.

## 2017-07-11 NOTE — Progress Notes (Signed)
[facility] Progress Note  Subjective:  Kaitlyn West is a 36 y.o. female with history of anemia, headaches, and elevated LFTs who presents for ongoing rash. Also notes vaginal discharge and itching.  #Rash: - Began on back this past spring - Has spread to chest and arms and legs - Very itchy - Scratching can elicit welts - Has tried some eczema cream on the area without relief - Took benadryl last night that made her sleep but unsure if helped with itching - Itching can be worse with heat (like hot shower) - No changes in detergents or recent travel ROS: No joint pains, no dyspnea  #Vaginal itching: - No new partners - Symptoms present for a couple of days - Increased dischage ROS: No fevers, no abdominal pain  Social: Does not drink EtOH  Allergies  Allergen Reactions  . Shellfish Allergy Itching   Objective: Blood pressure 130/72, pulse 82, temperature 98.8 F (37.1 C), temperature source Oral, height 5\' 1"  (1.549 m), weight 131 lb 12.8 oz (59.8 kg), SpO2 99 %. Body mass index is 24.9 kg/m. Constitutional: Well-appearing female in NAD HENT: MMM, normal posterior oropharnyx Pulmonary/Chest: Effort normal and breath sounds normal. No respiratory distress.  Abdominal: Soft. +BS, NT, ND, no masses  Musculoskeletal: No edema of extremities Skin: Raised linear patches across back that mimic scratching. Dry patch of R lower back. Erythematous slightly raised areas across anterior legs and R upper chest.  GU: Moderate amount of white discharge on speculum exam. No cervical motion tenderness on bimanual exam. Chaperone present.  Psychiatric: Normal mood and affect.  Vitals reviewed  Assessment/Plan: Rash and nonspecific skin eruption - Suspect chronic urticaria vs physical urticaria (heat worsens). With history of elevated LFTs, also want to obtain LFTs. - Obtain CMP. Obtain CBC with diff, looking for eosinophilia. - Trial daily zyrtec and BID zantac for  symptomatic management.  Vaginal itching - Wet prep shows BV. - Ordered metronidazole. Also provided rx for diflucan in case patient develops yeast infection.  Follow-up if no improvement in itching in about 1 month.  Olene Floss, MD [place], PGY-3

## 2017-12-21 ENCOUNTER — Other Ambulatory Visit: Payer: Self-pay

## 2017-12-21 ENCOUNTER — Encounter (HOSPITAL_COMMUNITY): Payer: Self-pay | Admitting: *Deleted

## 2017-12-21 ENCOUNTER — Emergency Department (HOSPITAL_COMMUNITY)
Admission: EM | Admit: 2017-12-21 | Discharge: 2017-12-21 | Disposition: A | Discharge status: Home / Self Care | Payer: Medicaid Other | Attending: Emergency Medicine | Admitting: Emergency Medicine

## 2017-12-21 DIAGNOSIS — J029 Acute pharyngitis, unspecified: Secondary | ICD-10-CM | POA: Diagnosis present

## 2017-12-21 DIAGNOSIS — Z9049 Acquired absence of other specified parts of digestive tract: Secondary | ICD-10-CM | POA: Insufficient documentation

## 2017-12-21 DIAGNOSIS — Z79899 Other long term (current) drug therapy: Secondary | ICD-10-CM | POA: Insufficient documentation

## 2017-12-21 DIAGNOSIS — J069 Acute upper respiratory infection, unspecified: Secondary | ICD-10-CM

## 2017-12-21 DIAGNOSIS — F17210 Nicotine dependence, cigarettes, uncomplicated: Secondary | ICD-10-CM | POA: Insufficient documentation

## 2017-12-21 DIAGNOSIS — B9789 Other viral agents as the cause of diseases classified elsewhere: Secondary | ICD-10-CM

## 2017-12-21 DIAGNOSIS — R05 Cough: Secondary | ICD-10-CM | POA: Insufficient documentation

## 2017-12-21 LAB — RAPID STREP SCREEN (MED CTR MEBANE ONLY): Streptococcus, Group A Screen (Direct): NEGATIVE

## 2017-12-21 MED ORDER — GUAIFENESIN-CODEINE 100-10 MG/5ML PO SOLN: 5.0000 mL | Freq: Three times a day (TID) | ORAL | 0 refills | Status: DC | PRN

## 2017-12-21 NOTE — Discharge Instructions (Signed)
It was my pleasure taking care of you today!   Your symptoms are likely due to a viral upper respiratory infection. Fortunately, we did not see evidence of serious infection and can treat your symptoms. Flonase for nasal congestion, Cough syrup as needed for cough. Alternate between Tylenol and ibuprofen as needed for body aches / fevers.   Rest, drink plenty of fluids to be sure you are staying hydrated.   Please follow up with your primary doctor for discussion of your diagnoses and further evaluation after today's visit if symptoms persist longer than 7 days; Return to the ER for high fevers, difficulty breathing or other concerning symptoms

## 2017-12-21 NOTE — ED Provider Notes (Signed)
[facility] DEPT Provider Note   CSN: [id number] Arrival date & time: 12/21/17  1802     History   Chief Complaint Chief Complaint  Patient presents with  . URI    HPI Kaitlyn West is a 37 y.o. female.  The history is provided by the patient and medical records. No language interpreter was used.  URI   Associated symptoms include congestion, sore throat and cough. Pertinent negatives include no chest pain, no abdominal pain, no diarrhea, no nausea, no vomiting and no wheezing.   Kaitlyn West is a 38 y.o. female  with a PMH as listed below who presents to the Emergency Department complaining of dry cough, nasal congestion and sore throat.  Patient reports symptoms initially started with just a cough about 2 weeks ago.  Over the last few days, she has developed nasal congestion and sore throat.  She has tried over-the-counter cold medication with no improvement.  She denies any fever, chills, chest pain, shortness of breath, abdominal pain, nausea or vomiting.  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  Past Medical History:  Diagnosis Date  . Acne 01/22/2011  . Anemia   . Headache   . Irregular heart rhythm   . Reflux     Patient Active Problem List   Diagnosis Date Noted  . Rash and nonspecific skin eruption 07/11/2017  . Vaginal itching 07/11/2017  . Transaminitis 04/06/2014  . Migraine 04/01/2013  . Bilateral leg edema 04/01/2013  . Health care maintenance 04/01/2013  . IDA (iron deficiency anemia) 10/07/2008  . SMOKER 10/07/2008    Past Surgical History:  Procedure Laterality Date  . CHOLECYSTECTOMY    . TUBAL LIGATION       OB History   None      Home Medications    Prior to Admission medications   Medication Sig Start Date End Date Taking? Authorizing Provider  cetirizine (ZYRTEC) 10 MG tablet Take 1 tablet (10 mg total) by mouth daily. 07/09/17   Rogue Bussing, MD    fluconazole (DIFLUCAN) 150 MG tablet Take 1 tablet if having vaginal itching. Repeat in 72 hours if still having symptoms. 07/09/17   Rogue Bussing, MD  guaiFENesin-codeine 100-10 MG/5ML syrup Take 5 mLs by mouth 3 (three) times daily as needed for cough. 12/21/17   Meilani Edmundson, Ozella Almond, PA-C  metroNIDAZOLE (FLAGYL) 500 MG tablet Take 1 tablet (500 mg total) by mouth 2 (two) times daily. 07/09/17   Rogue Bussing, MD  ranitidine (ZANTAC) 150 MG tablet Take 1 tablet (150 mg total) by mouth 2 (two) times daily. 07/09/17   Rogue Bussing, MD    Family History Family History  Problem Relation Age of Onset  . Migraines Mother   . Migraines Brother   . Migraines Daughter     Social History Social History   Tobacco Use  . Smoking status: Current Every Day Smoker    Packs/day: 0.50    Types: Cigarettes  . Smokeless tobacco: Never Used  Substance Use Topics  . Alcohol use: No  . Drug use: No     Allergies   Pistachio nut (diagnostic) and Shellfish allergy   Review of Systems Review of Systems  Constitutional: Negative for chills and fever.  HENT: Positive for congestion and sore throat. Negative for trouble swallowing.   Respiratory: Positive for cough. Negative for shortness of breath and wheezing.   Cardiovascular: Negative for chest pain.  Gastrointestinal: Negative for abdominal pain, diarrhea, nausea and vomiting.  Allergic/Immunologic: Negative for immunocompromised state.     Physical Exam Updated Vital Signs BP 108/70 (BP Location: Right Arm)   Pulse 89   Temp 98.6 F (37 C) (Oral)   Ht 5' 1.5" (1.562 m)   Wt 63.5 kg (140 lb)   SpO2 100%   BMI 26.02 kg/m   Physical Exam  Constitutional: She is oriented to person, place, and time. She appears well-developed and well-nourished. No distress.  HENT:  Head: Normocephalic and atraumatic.  OP with erythema, no exudates or tonsillar hypertrophy. + nasal congestion with mucosal edema.    Neck: Normal range of motion. Neck supple.  Cardiovascular: Normal rate, regular rhythm and normal heart sounds.  Pulmonary/Chest: Effort normal.  Lungs are clear to auscultation bilaterally - no w/r/r  Abdominal: Soft. She exhibits no distension. There is no tenderness.  Musculoskeletal: Normal range of motion.  Neurological: She is alert and oriented to person, place, and time.  Skin: Skin is warm and dry. She is not diaphoretic.  Nursing note and vitals reviewed.    ED Treatments / Results  Labs (all labs ordered are listed, but only abnormal results are displayed) Labs Reviewed  RAPID STREP SCREEN (NOT AT [facility])  CULTURE, GROUP A STREP [facility])    EKG None  Radiology No results found.  Procedures Procedures (including critical care time)  Medications Ordered in ED Medications - No data to display   Initial Impression / Assessment and Plan / ED Course  I have reviewed the triage vital signs and the nursing notes.  Pertinent labs & imaging results that were available during my care of the patient were reviewed by me and considered in my medical decision making (see chart for details).    Kaitlyn West is a 37 y.o. female who presents to ED for cough, congestion, sore throat.   On exam, patient is afebrile, non-toxic appearing with a clear lung exam. Mild rhinorrhea and OP with erythema but no exudates or tonsillar hypertrophy.  Rapid strep negative.  Sxs today likely due to viral URI.Symptomatic home care instructions discussed.  PCP follow up strongly encouraged if symptoms persist. Reasons to return to ER discussed. All questions answered.   Blood pressure 108/70, pulse 89, temperature 98.6 F (37 C), temperature source Oral, height 5' 1.5" (1.562 m), weight 63.5 kg (140 lb), SpO2 100 %.   Final Clinical Impressions(s) / ED Diagnoses   Final diagnoses:  Viral URI with cough    ED Discharge Orders        Ordered    guaiFENesin-codeine 100-10 MG/5ML syrup   3 times daily PRN     12/21/17 1945       Denario Bagot, Ozella Almond, PA-C 12/21/17 Phelps, Moran, DO 12/21/17 2307

## 2017-12-21 NOTE — ED Triage Notes (Signed)
URI symptoms for about a month, congestion, throat pain now has a dry cough.

## 2017-12-24 LAB — CULTURE, GROUP A STREP (THRC)

## 2018-04-28 ENCOUNTER — Other Ambulatory Visit: Payer: Self-pay

## 2018-04-28 ENCOUNTER — Ambulatory Visit (INDEPENDENT_AMBULATORY_CARE_PROVIDER_SITE_OTHER): Payer: Medicaid Other | Admitting: Family Medicine

## 2018-04-28 DIAGNOSIS — R21 Rash and other nonspecific skin eruption: Secondary | ICD-10-CM

## 2018-04-28 DIAGNOSIS — N898 Other specified noninflammatory disorders of vagina: Secondary | ICD-10-CM | POA: Diagnosis not present

## 2018-04-28 MED ORDER — TRIAMCINOLONE ACETONIDE 0.5 % EX OINT: 1.0000 "application " | TOPICAL_OINTMENT | Freq: Two times a day (BID) | CUTANEOUS | 1 refills | Status: DC

## 2018-04-28 MED ORDER — FLUCONAZOLE 150 MG PO TABS: ORAL_TABLET | ORAL | 0 refills | Status: DC

## 2018-04-28 MED ORDER — CETIRIZINE HCL 10 MG PO TABS
10.0000 mg | ORAL_TABLET | Freq: Every day | ORAL | 11 refills | Status: DC
Start: 2018-04-28 — End: 2019-06-04

## 2018-04-28 NOTE — Progress Notes (Signed)
   Subjective:   Patient ID: Kaitlyn West    DOB: 1981/01/06, 37 y.o. female   MRN: [id number]  CC: rash on back and stomach   HPI: Kaitlyn West is a 37 y.o. female who presents to clinic today for the following issue.  Rash Rash began at the beginning of January.  She noticed that it has progressively been getting worse.  She first noticed this on her upper back, now on her lower back and flank..  She reports lots of itching and it is painful when she scratches.  The skin is dry.  She reports sensation of burning when she showers.  No fever, chills, nausea, vomiting.  No recent travel or sick contacts.  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  No new medications.  No new body wash, laundry detergents, soaps or lotions.  She does not have a history of eczema or asthma.  She has tried po Benadryl and topical ointment which also has not helped.  She states she was previously seen by Dr. Burr Medico for this a while back and was prescribed Zyrtec which she has not been taking.   ROS: No fever, chills, nausea, vomiting.  No chest pain, shortness of breath.  Social: Patient is a current everyday smoker. Medications reviewed. Objective:   BP 102/60   Pulse 84   Temp 98.5 F (36.9 C) (Oral)   Ht 5\' 1"  (1.549 m)   Wt 149 lb 6.4 oz (67.8 kg)   LMP 04/12/2018 (Exact Date)   SpO2 99%   BMI 28.23 kg/m  Vitals and nursing note reviewed.  General: Pleasant 37 year old female, NAD HEENT: NCAT, EOMI, PERRL, MMM, oropharynx clear Neck: supple, non-tender, normal ROM, no LAD  CV: RRR no MRG  Lungs: CTAB, normal effort  Abdomen: soft, NTND, no masses or organomegaly, +bs  Skin: warm, dry, scaly plaques with mild erythema noted over left flank and right upper back consistent with eczema, lesions are not annular Neuro: alert, oriented x3, no focal deficits   Assessment & Plan:   Rash Physical exam consistent with eczema.  Rash is dry, scaly and over upper  back and left flank area.  Pruritus noted.  We will trial triamcinolone -Rx: Triamcinolone, apply topically 2 times a day -Continue Zyrtec - Follow-up if symptoms worsen or do not improve  Vaginal itching Patient reports vaginal itching and irritation, she is certain this is a yeast infection.  Declines wet prep and STD testing today. - Rx: Diflucan, 2 tabs - Advised patient to follow-up for testing if this does not take care of her symptoms  Meds ordered this encounter  Medications  . triamcinolone ointment (KENALOG) 0.5 %    Sig: Apply 1 application topically 2 (two) times daily.    Dispense:  30 g    Refill:  1  . cetirizine (ZYRTEC) 10 MG tablet    Sig: Take 1 tablet (10 mg total) by mouth daily.    Dispense:  30 tablet    Refill:  11  . fluconazole (DIFLUCAN) 150 MG tablet    Sig: Take 1 tablet if having vaginal itching. Repeat in 72 hours if still having symptoms.    Dispense:  2 tablet    Refill:  0   Lovenia Kim, MD [facility], PGY-3 05/04/2018 9:22 AM

## 2018-04-28 NOTE — Patient Instructions (Signed)
It was nice meeting you today!  You were seen in clinic for a rash on your back and stomach which is most likely atopic dermatitis, or eczema.  As we discussed, we can try a topical ointment called triamcinolone.  I would like you to apply this twice daily to the affected areas.  It is important to avoid scratching the skin.  Additionally, it is important to avoid lotions and bath products that are scented as these can further irritate the skin.  I am including some information below that you may find helpful.   I have refilled your Zyrtec and would recommend taking this daily.  If you have any new or worsening symptoms, please make sure to be seen by a provider.  Be well, Lovenia Kim MD    Eczema Eczema is a broad term for a group of skin conditions that cause skin to become rough and inflamed. Each type of eczema has different triggers, symptoms, and treatments. Eczema of any type is usually itchy and symptoms range from mild to severe. Eczema and its symptoms are not spread from person to person (are not contagious). It can appear on different parts of the body at different times. Your eczema may not look the same as someone else's eczema. What are the types of eczema? Atopic dermatitis This is a long-term (chronic) skin disease that keeps coming back (recurring). Usual symptoms are dry skin and small, solid pimples that may swell and leak fluid (weep). Contact dermatitis This happens when something irritates the skin and causes a rash. The irritation can come from substances that you are allergic to (allergens), such as poison ivy, chemicals, or medicines that were applied to your skin. Dyshidrotic eczema This is a form of eczema on the hands and feet. It shows up as very itchy, fluid-filled blisters. It can affect people of any age, but is more common before age 80. Hand eczema This causes very itchy areas of skin on the palms and sides of the hands and fingers. This type of eczema is common  in industrial jobs where you may be exposed to many different types of irritants. Lichen simplex chronicus This type of eczema occurs when a person constantly scratches one area of the body. Repeated scratching of the area leads to thickened skin (lichenification). Lichen simplex chronicus can occur along with other types of eczema. It is more common in adults, but may be seen in children as well. Nummular eczema This is a common type of eczema. It has no known cause. It typically causes a red, circular, crusty lesion (plaque) that may be itchy. Scratching may become a habit and can cause bleeding. Nummular eczema occurs most often in people of middle-age or older. It most often affects the hands. Seborrheic dermatitis This is a common skin disease that mainly affects the scalp. It may also affect any oily areas of the body, such as the face, sides of nose, eyebrows, ears, eyelids, and chest. It is marked by small scaling and redness of the skin (erythema). This can affect people of all ages. In infants, this condition is known as Chartered certified accountant." Stasis dermatitis This is a common skin disease that usually appears on the legs and feet. It most often occurs in people who have a condition that prevents blood from being pumped through the veins in the legs (chronic venous insufficiency). Stasis dermatitis is a chronic condition that needs long-term management. How is eczema diagnosed? Your health care provider will examine your skin and review  your medical history. He or she may also give you skin patch tests. These tests involve taking patches that contain possible allergens and placing them on your back. He or she will then check in a few days to see if an allergic reaction occurred. What are the common treatments? Treatment for eczema is based on the type of eczema you have. Hydrocortisone steroid medicine can relieve itching quickly and help reduce inflammation. This medicine may be prescribed or obtained  over-the-counter, depending on the strength of the medicine that is needed. Follow these instructions at home:  Take over-the-counter and prescription medicines only as told by your health care provider.  Use creams or ointments to moisturize your skin. Do not use lotions.  Learn what triggers or irritates your symptoms. Avoid these things.  Treat symptom flare-ups quickly.  Do not itch your skin. This can make your rash worse.  Keep all follow-up visits as told by your health care provider. This is important. Where to find more information:  The American Academy of Dermatology: http://jones-macias.info/  The National Eczema Association: www.nationaleczema.org Contact a health care provider if:  You have serious itching, even with treatment.  You regularly scratch your skin until it bleeds.  Your rash looks different than usual.  Your skin is painful, swollen, or more red than usual.  You have a fever. Summary  There are eight general types of eczema. Each type has different triggers.  Eczema of any type causes itching that may range from mild to severe.  Treatment varies based on the type of eczema you have. Hydrocortisone steroid medicine can help with itching and inflammation.  Protecting your skin is the best way to prevent eczema. Use moisturizers and lotions. Avoid triggers and irritants, and treat flare-ups quickly. This information is not intended to replace advice given to you by your health care provider. Make sure you discuss any questions you have with your health care provider. Document Released: 01/30/2017 Document Revised: 01/30/2017 Document Reviewed: 01/30/2017 Elsevier Interactive Patient Education  2018 [employer].

## 2018-04-30 DIAGNOSIS — R21 Rash and other nonspecific skin eruption: Secondary | ICD-10-CM | POA: Insufficient documentation

## 2018-04-30 NOTE — Assessment & Plan Note (Addendum)
Physical exam consistent with eczema.  Rash is dry, scaly and over upper back and left flank area.  Pruritus noted.  We will trial triamcinolone -Rx: Triamcinolone, apply topically 2 times a day -Continue Zyrtec - Follow-up if symptoms worsen or do not improve

## 2018-05-04 NOTE — Assessment & Plan Note (Signed)
Patient reports vaginal itching and irritation, she is certain this is a yeast infection.  Declines wet prep and STD testing today. - Rx: Diflucan, 2 tabs - Advised patient to follow-up for testing if this does not take care of her symptoms

## 2018-05-19 ENCOUNTER — Ambulatory Visit (INDEPENDENT_AMBULATORY_CARE_PROVIDER_SITE_OTHER): Payer: Medicaid Other | Admitting: Family Medicine

## 2018-05-19 ENCOUNTER — Other Ambulatory Visit: Payer: Self-pay

## 2018-05-19 ENCOUNTER — Encounter: Payer: Self-pay | Admitting: Family Medicine

## 2018-05-19 DIAGNOSIS — L299 Pruritus, unspecified: Secondary | ICD-10-CM

## 2018-05-19 DIAGNOSIS — D508 Other iron deficiency anemias: Secondary | ICD-10-CM | POA: Diagnosis not present

## 2018-05-19 NOTE — Progress Notes (Signed)
Subjective  Kaitlyn West is a 37 y.o. female is presenting with the following  RASH ITCHING Patches of itchy skin that she scratches on her abdomen and back on upper legs.  Has had on and off since October?   Uses triamcinolone which helps a little.  Worse thing is the itching.  Takes 2 showers a day and uses Dove scented soap no moisturizer.  No fever chills abdomen pain jaundice sores in mouth or feeling bad.    Zyrtec helps a little   ANEMIA Knows her iron is low but does not take due to constipation.  Feels tired but no chest pain or shortness of breath or edema  Chief Complaint noted Review of Symptoms - see HPI PMH - Smoking status noted.  History of hepatitis in the past due to medications?   Objective Vital Signs reviewed BP 102/62   Pulse 94   Temp 98.6 F (37 C) (Oral)   Wt 149 lb 3.2 oz (67.7 kg)   SpO2 99%   BMI 28.19 kg/m   Healthy appearing No jaundice Patches of mildly excoriated dry skin on upper back and abdomen - Not well defined Skin in between appears moist and not dry. Mouth - no lesions, mucous membranes are moist, no decaying teeth     Assessments/Plans  See after visit summary for details of patient instuctions  IDA (iron deficiency anemia) Will check a ferritin.  If low consider replacing iron perhaps parentally   Pruritus Rash seems to be more itching than focal skin disruption.   Perhaps eczema but will rule out cholestasis with history of hepatitis.  See after visit summary for plan

## 2018-05-19 NOTE — Patient Instructions (Signed)
Good to see you today!  Thanks for coming in.  Continue to use the ointment twice a day on all places  Change soap to plain dove or basis soap  Use a moisturizer after your shower.  Try to shower only once a day or every other day  We will let you know the blood test and follow up  If this does not improve follow up with Dr Burr Medico and also for your iron

## 2018-05-19 NOTE — Assessment & Plan Note (Signed)
Will check a ferritin.  If low consider replacing iron perhaps parentally

## 2018-05-19 NOTE — Assessment & Plan Note (Signed)
Rash seems to be more itching than focal skin disruption.   Perhaps eczema but will rule out cholestasis with history of hepatitis.  See after visit summary for plan

## 2018-05-20 ENCOUNTER — Encounter: Payer: Self-pay | Admitting: Family Medicine

## 2018-05-20 LAB — HEPATIC FUNCTION PANEL
ALBUMIN: 4.4 g/dL (ref 3.5–5.5)
ALT: 14 IU/L (ref 0–32)
AST: 15 IU/L (ref 0–40)
Alkaline Phosphatase: 79 IU/L (ref 39–117)
BILIRUBIN TOTAL: 0.8 mg/dL (ref 0.0–1.2)
Bilirubin, Direct: 0.17 mg/dL (ref 0.00–0.40)
Total Protein: 7.6 g/dL (ref 6.0–8.5)

## 2018-05-20 LAB — FERRITIN: Ferritin: 7 ng/mL — ABNORMAL LOW (ref 15–150)

## 2018-06-22 ENCOUNTER — Ambulatory Visit (INDEPENDENT_AMBULATORY_CARE_PROVIDER_SITE_OTHER): Payer: Medicaid Other | Admitting: *Deleted

## 2018-06-22 DIAGNOSIS — Z23 Encounter for immunization: Secondary | ICD-10-CM | POA: Diagnosis present

## 2018-08-13 ENCOUNTER — Other Ambulatory Visit: Payer: Self-pay

## 2018-08-13 ENCOUNTER — Encounter (HOSPITAL_COMMUNITY): Payer: Self-pay

## 2018-08-13 ENCOUNTER — Ambulatory Visit (HOSPITAL_COMMUNITY)
Admission: EM | Admit: 2018-08-13 | Discharge: 2018-08-13 | Disposition: A | Discharge status: Home / Self Care | Payer: Worker's Compensation | Attending: Family Medicine | Admitting: Family Medicine

## 2018-08-13 DIAGNOSIS — S0502XA Injury of conjunctiva and corneal abrasion without foreign body, left eye, initial encounter: Secondary | ICD-10-CM | POA: Diagnosis not present

## 2018-08-13 MED ORDER — POLYMYXIN B-TRIMETHOPRIM 10000-0.1 UNIT/ML-% OP SOLN: 1.0000 [drp] | Freq: Four times a day (QID) | OPHTHALMIC | 0 refills | Status: AC

## 2018-08-13 MED ORDER — HYPROMELLOSE 0.3 % OP GEL
Freq: Three times a day (TID) | OPHTHALMIC | 0 refills | Status: DC | PRN
Start: 2018-08-13 — End: 2019-06-07

## 2018-08-13 NOTE — Discharge Instructions (Addendum)
Use polytrim eye drops as prescribed and to completion Use genteal tears as needed for symptomatic relief Use OTC ibuprofen or tylenol as needed for pain relief Return here or follow up with ophthamolgy if symptoms persists or worsen

## 2018-08-13 NOTE — ED Provider Notes (Signed)
[place]   [id number] 08/13/18 Arrival Time: 1618  CC: FB sensation   SUBJECTIVE:  Kaitlyn West is a 37 y.o. female who presents with complaint of left eye pain and FB sensation that began 3 hours ago.  Symptoms began after removing a Santa cardboard cut-out from her desk when it hit her in the eye.  Has tried irrigation without relief  Symptoms are made worse with opening eye. Complains of tearing, and blurred vision.  Denies discharge, itching,  double vision, fever, chills, nausea, or vomiting.    Denies contact lens use.    ROS: As per HPI.  Past Medical History:  Diagnosis Date  . Acne 01/22/2011  . Anemia   . Headache   . Irregular heart rhythm   . Reflux    Past Surgical History:  Procedure Laterality Date  . CHOLECYSTECTOMY    . TUBAL LIGATION     Allergies  Allergen Reactions  . Pistachio Nut (Diagnostic)   . Shellfish Allergy Itching   No current facility-administered medications on file prior to encounter.    Current Outpatient Medications on File Prior to Encounter  Medication Sig Dispense Refill  . cetirizine (ZYRTEC) 10 MG tablet Take 1 tablet (10 mg total) by mouth daily. 30 tablet 11  . fluconazole (DIFLUCAN) 150 MG tablet Take 1 tablet if having vaginal itching. Repeat in 72 hours if still having symptoms. 2 tablet 0  . guaiFENesin-codeine 100-10 MG/5ML syrup Take 5 mLs by mouth 3 (three) times daily as needed for cough. 60 mL 0  . metroNIDAZOLE (FLAGYL) 500 MG tablet Take 1 tablet (500 mg total) by mouth 2 (two) times daily. 14 tablet 0  . ranitidine (ZANTAC) 150 MG tablet Take 1 tablet (150 mg total) by mouth 2 (two) times daily. 60 tablet 1  . triamcinolone ointment (KENALOG) 0.5 % Apply 1 application topically 2 (two) times daily. 30 g 1   Social History   Socioeconomic History  . Marital status: Single    Spouse name: Not on file  . Number of children: 2  . Years of education: Not on file  . Highest education level: Not on file    Occupational History  . Occupation: Unemployed     Employer: [employer]  . Financial resource strain: Not on file  . Food insecurity:    Worry: Not on file    Inability: Not on file  . Transportation needs:    Medical: Not on file    Non-medical: Not on file  Tobacco Use  . Smoking status: Current Every Day Smoker    Packs/day: 0.50    Types: Cigarettes  . Smokeless tobacco: Never Used  Substance and Sexual Activity  . Alcohol use: No  . Drug use: No  . Sexual activity: Yes    Birth control/protection: Surgical  Lifestyle  . Physical activity:    Days per week: Not on file    Minutes per session: Not on file  . Stress: Not on file  Relationships  . Social connections:    Talks on phone: Not on file    Gets together: Not on file    Attends religious service: Not on file    Active member of club or organization: Not on file    Attends meetings of clubs or organizations: Not on file    Relationship status: Not on file  . Intimate partner violence:    Fear of current or ex partner: Not on file  Emotionally abused: Not on file    Physically abused: Not on file    Forced sexual activity: Not on file  Other Topics Concern  . Not on file  Social History Narrative   Married since 2008.   2 daughters.    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    Family History  Problem Relation Age of Onset  . Migraines Mother   . Migraines Brother   . Migraines Daughter     OBJECTIVE:    Visual Acuity  Right Eye Distance: 20/13 Left Eye Distance: 20/13 Bilateral Distance: 20/13  Right Eye Near:   Left Eye Near:    Bilateral Near:      Vitals:   08/13/18 1729 08/13/18 1731  BP: 121/61   Pulse: 77   Resp: 18   Temp: 99.2 F (37.3 C)   SpO2: 100%   Weight:  149 lb 3.2 oz (67.7 kg)    General appearance: alert; no distress Eyes: Mild conjunctival erythema. PERRL; EOMI grossly without discomfort;  Mild clear drainage; + fluorescein uptake in 12-4 o'clock  position (see picture below) Neck: supple Lungs: clear to auscultation bilaterally Heart: regular rate and rhythm Skin: warm and dry Psychological: alert and cooperative; normal mood and affect      ASSESSMENT & PLAN:  1. Abrasion of left cornea, initial encounter     Meds ordered this encounter  Medications  . trimethoprim-polymyxin b (POLYTRIM) ophthalmic solution    Sig: Place 1 drop into the left eye 4 (four) times daily for 7 days.    Dispense:  10 mL    Refill:  0    Order Specific Question:   Supervising Provider    Answer:   Wynona Luna [[id number]]  . hypromellose (GENTEAL) 0.3 % GEL ophthalmic ointment    Sig: Place into the left eye 3 (three) times daily as needed for dry eyes.    Dispense:  3.5 g    Refill:  0    Order Specific Question:   Supervising Provider    Answer:   Wynona Luna [[id number]]   Use polytrim eye drops as prescribed and to completion Use genteal tears as needed for symptomatic relief Use OTC ibuprofen or tylenol as needed for pain relief Return here or follow up with ophthamolgy if symptoms persists or worsen   Reviewed expectations re: course of current medical issues. Questions answered. Outlined signs and symptoms indicating need for more acute intervention. Patient verbalized understanding. After Visit Summary given.   Lestine Box, PA-C 08/13/18 [phone]

## 2018-08-13 NOTE — ED Triage Notes (Signed)
Pt was removing something from her desk and she was pulling out some and something hit her in the eye this happen 3 hours ago.

## 2018-09-09 ENCOUNTER — Encounter: Payer: Self-pay | Admitting: Family Medicine

## 2018-09-09 ENCOUNTER — Other Ambulatory Visit: Payer: Self-pay

## 2018-09-09 ENCOUNTER — Ambulatory Visit: Payer: Medicaid Other | Admitting: Family Medicine

## 2018-09-09 VITALS — BP 101/61 | HR 82 | Temp 98.1°F | Wt 145.0 lb

## 2018-09-09 DIAGNOSIS — N750 Cyst of Bartholin's gland: Secondary | ICD-10-CM | POA: Diagnosis not present

## 2018-09-09 DIAGNOSIS — N898 Other specified noninflammatory disorders of vagina: Secondary | ICD-10-CM | POA: Diagnosis not present

## 2018-09-09 LAB — POCT WET PREP (WET MOUNT)
CLUE CELLS WET PREP WHIFF POC: NEGATIVE
TRICHOMONAS WET PREP HPF POC: ABSENT

## 2018-09-09 NOTE — Assessment & Plan Note (Signed)
On left.  Very mild.  No overlying skin changes or evidence of infection.  Suspect that oil may be contributing, therefore recommended patient stop using. -Advised patient to use warm compresses at least 2 times a day to the area -Advised to come back if does not improve in the next 2 weeks, if worsens, if fever develops

## 2018-09-09 NOTE — Assessment & Plan Note (Addendum)
Appears physiologic, wet prep obtained.  Only few clue cells, otherwise unremarkable.  Patient not bothered by discharge.  Will opt not to treat at this time.

## 2018-09-09 NOTE — Patient Instructions (Addendum)
Thank you for coming to see me today. It was a pleasure. Today we talked about:   Your vaginal bumps and vaginal discharge.  I think that your bump is caused by a blocked duct.  Use warm compresses at least 3 times a day on the affected area.    I will let you know if your discharge is abnormal.  Please follow-up in 2 weeks if you do not see improvement or worsens.  Call the clinic if you develop a fever.  If you have any questions or concerns, please do not hesitate to call the office at [phone].  Best,  Dr. Mel Almond

## 2018-09-09 NOTE — Progress Notes (Signed)
Subjective: No chief complaint on file.    HPI: Kaitlyn West is a 37 y.o. presenting to clinic today to discuss the following:  Vaginal bumps Patient states that 1 month ago she noticed she had "2 little knots at the opening of her vagina."  States that she has never had anything like this before.  She states that they are not painful, have not created pustules, have not drained.  At the same time she noticed that she had an increase in her discharge, but no change in the character, consistency, small.  She states that she does believe it may be slightly pruritic, although states that she has been hyper-aware of the area since she first noticed the bumps.  States that she has been itching the area, and has noticed some mild burning when washing which she attributes to scratches.  Denies any dysuria.  No sexual encounters in the last 3 months, therefore unable to comment on pain with intercourse.  Patient denies concern for new STDs.  States that she has a remote history of gonorrhea or chlamydia which she believes was diagnosed in 2007.  She is s/p tubal ligation.  Notes that she does not shave her pubic area, uses all natural soap only on the outer surface of her vagina.  She also states that she uses Yoni oil daily at the fall of her vagina, and the first application of this is when she noticed the bumps.  LMP November 18.  No recent fevers, chills, night sweats.  Health Maintenance: Pap UTD, negative Pap with negative high-risk HPV on 06/24/2016     ROS noted in HPI.   Past Medical, Surgical, Social, and Family History Reviewed & Updated per EMR.   Pertinent Historical Findings include:   Social History   Tobacco Use  Smoking Status Current Every Day Smoker  . Packs/day: 0.50  . Types: Cigarettes  Smokeless Tobacco Never Used      Objective: BP 101/61   Pulse 82   Temp 98.1 F (36.7 C) (Oral)   Wt 145 lb (65.8 kg)   LMP 08/17/2018   SpO2 99%   BMI 27.40 kg/m    Vitals and nursing notes reviewed  Physical Exam: General: 37 y.o. female in NAD Cardio: RRR no m/r/g Lungs: CTAB, no wheezing, no rhonchi, no crackles, no increased work of breathing Skin: warm and dry Extremities: No edema General: 2 to 3 mm nodule palpated in the left posterior vaginal vestibule, no overlying erythema, drainage, or skin breakdown, nontender to palpation, otherwise vagina without defect, cervix visualized with nabothian cyst at 12:00, some thin white discharge noted in vault    Results for orders placed or performed in visit on 09/09/18 (from the past 72 hour(s))  POCT Wet Prep [facility])     Status: Abnormal   Collection Time: 09/09/18  2:35 PM  Result Value Ref Range   Source Wet Prep POC VAG    WBC, Wet Prep HPF POC 1-3    Bacteria Wet Prep HPF POC Moderate (A) Few   Clue Cells Wet Prep HPF POC Few (A) None   Clue Cells Wet Prep Whiff POC Negative Whiff    Yeast Wet Prep HPF POC None None   Trichomonas Wet Prep HPF POC Absent Absent    Assessment/Plan:  Vaginal discharge Appears physiologic, wet prep obtained.  Only few clue cells, otherwise unremarkable.  Patient not bothered by discharge.  Will opt not to treat at this time.  Bartholin cyst On left.  Very mild.  No overlying skin changes or evidence of infection.  Suspect that oil may be contributing, therefore recommended patient stop using. -Advised patient to use warm compresses at least 2 times a day to the area -Advised to come back if does not improve in the next 2 weeks, if worsens, if fever develops     PATIENT EDUCATION PROVIDED: See AVS    Diagnosis and plan along with any newly prescribed medication(s) were discussed in detail with this patient today. The patient verbalized understanding and agreed with the plan. Patient advised if symptoms worsen return to clinic or ER.   Health Maintainance:   Orders Placed This Encounter  Procedures  . POCT Wet Prep  [facility])    No orders  of the defined types were placed in this encounter.    [place] Constable, DO 09/09/2018, 3:02 PM PGY-1 [facility]

## 2018-10-27 ENCOUNTER — Encounter (HOSPITAL_COMMUNITY): Payer: Self-pay | Admitting: Emergency Medicine

## 2018-10-27 ENCOUNTER — Other Ambulatory Visit: Payer: Self-pay

## 2018-10-27 ENCOUNTER — Ambulatory Visit (HOSPITAL_COMMUNITY)
Admission: EM | Admit: 2018-10-27 | Discharge: 2018-10-27 | Disposition: A | Discharge status: Home / Self Care | Payer: Medicaid Other | Attending: Family Medicine | Admitting: Family Medicine

## 2018-10-27 DIAGNOSIS — L309 Dermatitis, unspecified: Secondary | ICD-10-CM | POA: Insufficient documentation

## 2018-10-27 MED ORDER — FLUOCINONIDE-E 0.05 % EX CREA: 1.0000 "application " | TOPICAL_CREAM | Freq: Two times a day (BID) | CUTANEOUS | 2 refills | Status: DC

## 2018-10-27 NOTE — ED Triage Notes (Signed)
Patient has a history of eczema.  Patient says creams are not working

## 2018-10-28 ENCOUNTER — Telehealth (HOSPITAL_COMMUNITY): Payer: Self-pay | Admitting: Emergency Medicine

## 2018-10-28 MED ORDER — MOMETASONE FUROATE 0.1 % EX CREA: 1.0000 "application " | TOPICAL_CREAM | Freq: Every day | CUTANEOUS | 0 refills | Status: AC

## 2018-10-28 NOTE — Telephone Encounter (Signed)
Pt called stating her medicine needed authorization. Dr. Mannie Stabile gave verbal order to change script to mometasone cream.

## 2018-10-28 NOTE — ED Provider Notes (Signed)
[place]   [id number] 10/27/18 Arrival Time: 5102  ASSESSMENT & PLAN:  1. Eczema, unspecified type    Meds ordered this encounter  Medications  . fluocinonide-emollient (LIDEX-E) 0.05 % cream    Sig: Apply 1 application topically 2 (two) times daily.    Dispense:  60 g    Refill:  2   Would likely benefit from seeing a dermatologist. Discussed. Will follow up with PCP or here if worsening or failing to improve as anticipated. Reviewed expectations re: course of current medical issues. Questions answered. Outlined signs and symptoms indicating need for more acute intervention. Patient verbalized understanding. After Visit Summary given.   SUBJECTIVE:  Kaitlyn West is a 38 y.o. female who presents with a skin complaint.   Location: torso and extremities mainly Onset: gradual Duration: long-term but worse over the past few weeks with dry weather Associated pruritis? "intense" Associated pain? none Progression: stable  Drainage? No  Other associated symptoms: none Therapies tried thus far: Triamcinolone cream without much relief Arthralgia or myalgia? none Recent illness? none Fever? none No specific aggravating or alleviating factors reported.  ROS: As per HPI.  OBJECTIVE:  General appearance: alert; no distress Lungs: clear to auscultation bilaterally Heart: regular rate and rhythm Extremities: no edema Skin: warm and dry; signs of infection: no; eczema of extremities and torso; very dry skin with random areas of excoriation Psychological: alert and cooperative; normal mood and affect  Allergies  Allergen Reactions  . Pistachio Nut (Diagnostic)   . Shellfish Allergy Itching    Past Medical History:  Diagnosis Date  . Acne 01/22/2011  . Anemia   . Headache   . Irregular heart rhythm   . Reflux    Social History   Socioeconomic History  . Marital status: Single    Spouse name: Not on file  . Number of children: 2  . Years of  education: Not on file  . Highest education level: Not on file  Occupational History  . Occupation: Unemployed     Employer: [employer]  . Financial resource strain: Not on file  . Food insecurity:    Worry: Not on file    Inability: Not on file  . Transportation needs:    Medical: Not on file    Non-medical: Not on file  Tobacco Use  . Smoking status: Current Every Day Smoker    Packs/day: 0.50    Types: Cigarettes  . Smokeless tobacco: Never Used  Substance and Sexual Activity  . Alcohol use: No  . Drug use: No  . Sexual activity: Yes    Birth control/protection: Surgical  Lifestyle  . Physical activity:    Days per week: Not on file    Minutes per session: Not on file  . Stress: Not on file  Relationships  . Social connections:    Talks on phone: Not on file    Gets together: Not on file    Attends religious service: Not on file    Active member of club or organization: Not on file    Attends meetings of clubs or organizations: Not on file    Relationship status: Not on file  . Intimate partner violence:    Fear of current or ex partner: Not on file    Emotionally abused: Not on file    Physically abused: Not on file    Forced sexual activity: Not on file  Other Topics Concern  . Not on file  Social History Narrative  Married since 2008.   2 daughters.    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    Family History  Problem Relation Age of Onset  . Migraines Mother   . Migraines Brother   . Migraines Daughter    Past Surgical History:  Procedure Laterality Date  . CHOLECYSTECTOMY    . TUBAL LIGATION       Vanessa Kick, MD 10/28/18 [phone]

## 2018-11-17 ENCOUNTER — Ambulatory Visit: Payer: Medicaid Other | Admitting: Student in an Organized Health Care Education/Training Program

## 2019-02-15 ENCOUNTER — Telehealth: Payer: Medicaid Other | Admitting: Student in an Organized Health Care Education/Training Program

## 2019-02-15 ENCOUNTER — Other Ambulatory Visit: Payer: Self-pay | Admitting: Student in an Organized Health Care Education/Training Program

## 2019-02-15 ENCOUNTER — Other Ambulatory Visit: Payer: Self-pay

## 2019-02-15 ENCOUNTER — Encounter: Payer: Self-pay | Admitting: *Deleted

## 2019-02-15 VITALS — Wt 143.0 lb

## 2019-02-15 DIAGNOSIS — Z0000 Encounter for general adult medical examination without abnormal findings: Secondary | ICD-10-CM

## 2019-02-15 DIAGNOSIS — Z1231 Encounter for screening mammogram for malignant neoplasm of breast: Secondary | ICD-10-CM

## 2019-02-15 DIAGNOSIS — E2839 Other primary ovarian failure: Secondary | ICD-10-CM

## 2019-02-15 NOTE — Progress Notes (Signed)
Patient needs screening EKG, PAP, and mammo to enter [employer]. Mammogram ordered today (see separate note) -  she turns 38 on 7/11. She is scheduled for a virtual visit but all of her action items require an in-person visit. OV was scheduled over the phone. I will no-charge today's visit.

## 2019-02-15 NOTE — Progress Notes (Signed)
Patient reports that she needs screening mammography to enter the [employer]. She is 38 years old. She needs screening exam completed prior to her 89th birthday on 7/11 per per report. Order for screening mammo was placed today.

## 2019-02-25 ENCOUNTER — Ambulatory Visit (INDEPENDENT_AMBULATORY_CARE_PROVIDER_SITE_OTHER): Payer: Medicaid Other | Admitting: Family Medicine

## 2019-02-25 ENCOUNTER — Encounter: Payer: Self-pay | Admitting: Family Medicine

## 2019-02-25 ENCOUNTER — Other Ambulatory Visit: Payer: Self-pay

## 2019-02-25 ENCOUNTER — Ambulatory Visit (HOSPITAL_COMMUNITY)
Admission: RE | Admit: 2019-02-25 | Discharge: 2019-02-25 | Disposition: A | Discharge status: Home / Self Care | Payer: Medicaid Other | Source: Ambulatory Visit | Attending: Family Medicine | Admitting: Family Medicine

## 2019-02-25 ENCOUNTER — Telehealth: Payer: Self-pay | Admitting: Licensed Clinical Social Worker

## 2019-02-25 ENCOUNTER — Other Ambulatory Visit (HOSPITAL_COMMUNITY)
Admission: RE | Admit: 2019-02-25 | Discharge: 2019-02-25 | Disposition: A | Discharge status: Home / Self Care | Payer: Medicaid Other | Source: Ambulatory Visit | Attending: Emergency Medicine | Admitting: Emergency Medicine

## 2019-02-25 VITALS — BP 100/70 | HR 91 | Temp 98.5°F | Ht 61.0 in | Wt 146.2 lb

## 2019-02-25 DIAGNOSIS — Z0000 Encounter for general adult medical examination without abnormal findings: Secondary | ICD-10-CM | POA: Insufficient documentation

## 2019-02-25 DIAGNOSIS — Z8349 Family history of other endocrine, nutritional and metabolic diseases: Secondary | ICD-10-CM | POA: Insufficient documentation

## 2019-02-25 DIAGNOSIS — Z113 Encounter for screening for infections with a predominantly sexual mode of transmission: Secondary | ICD-10-CM | POA: Diagnosis present

## 2019-02-25 DIAGNOSIS — Z8041 Family history of malignant neoplasm of ovary: Secondary | ICD-10-CM | POA: Diagnosis not present

## 2019-02-25 DIAGNOSIS — D509 Iron deficiency anemia, unspecified: Secondary | ICD-10-CM

## 2019-02-25 DIAGNOSIS — Z124 Encounter for screening for malignant neoplasm of cervix: Secondary | ICD-10-CM | POA: Diagnosis present

## 2019-02-25 DIAGNOSIS — F17200 Nicotine dependence, unspecified, uncomplicated: Secondary | ICD-10-CM

## 2019-02-25 MED ORDER — VARENICLINE TARTRATE 0.5 MG X 11 & 1 MG X 42 PO MISC: ORAL | 0 refills | Status: DC

## 2019-02-25 NOTE — Progress Notes (Signed)
   CC: [employer] enrollment   HPI  Has been eating ice. Hx of iron deficiency anemia. Was taking geritol.   MGM with breast and ovarian cancer in her late 60s.   Going into the [employer], they require EKG, mammo, and PAP. Previous pap normal. One female partner, would like STI testing.   Smokes 1/2 ppd, has tried chantix once and had GI upset.   ROS: Denies CP, SOB, abdominal pain, dysuria, changes in BMs.   CC, SH/smoking status, and VS noted  Objective: BP 100/70   Pulse 91   Temp 98.5 F (36.9 C) (Oral)   Ht 5\' 1"  (1.549 m)   Wt 146 lb 3.2 oz (66.3 kg)   LMP 02/12/2019 (Exact Date)   SpO2 99%   BMI 27.62 kg/m  Gen: NAD, alert, cooperative, and pleasant. HEENT: NCAT, EOMI, PERRL CV: RRR, no murmur Resp: CTAB, no wheezes, non-labored Abd: SNTND, BS present, no guarding or organomegaly GU: normal external female genitalia and normal cervix, no adnexal tenderness.  Ext: No edema, warm Neuro: Alert and oriented, Speech clear, No gross deficits  Assessment and plan:  IDA (iron deficiency anemia) Hx of similar. Recheck CBC today, pica suggests likely recurrence.   Health care maintenance EKG today normal, recommend proceeding w mammo as she has hx of breast cancer in MGM. PAP performed.   Family hx of ovarian malignancy Referred to genetics to discuss hx of ovarian cancer and breast cancer.   SMOKER Motivated to quit before [employer] enrollment, retry chantix.   Family history of G6PD Patient reports her father and daughter both have G6PD deficiency.  They are both in the [place] and I suspect they were routinely tested as these patients may require antimalarial prophylaxis which is a known risk factor for hemolysis in a patient with G6PD.  She has not had any symptoms that she knows of.  Her anemia has previously been iron deficiency.  I asked her to discuss this with her Development worker, community and ask whether there would be uniform testing at her enrollment.  We could  attempt to test for this.  I am not sure if Medicaid would pay for it or not without known exposure to antimalarial's.   Orders Placed This Encounter  Procedures  . CBC  . Basic metabolic panel  . Ferritin  . Ambulatory referral to Genetics    Referral Priority:   Routine    Referral Type:   Consultation    Referral Reason:   Specialty Services Required    Number of Visits Requested:   1  . EKG 12-Lead    Meds ordered this encounter  Medications  . varenicline (CHANTIX PAK) 0.5 MG X 11 & 1 MG X 42 tablet    Sig: Take one 0.5 mg tablet by mouth once daily for 3 days, then increase to one 0.5 mg tablet twice daily for 4 days, then increase to one 1 mg tablet twice daily.    Dispense:  53 tablet    Refill:  0     Ralene Ok, MD, PGY3 02/26/2019 11:34 AM

## 2019-02-25 NOTE — Telephone Encounter (Signed)
A genetic counseling appt has been scheduled for the pt to see Brianna on 7/13 at 1pm. Letter mailed.

## 2019-02-25 NOTE — Patient Instructions (Addendum)
Call 1800-QUIT-NOW for help with stopping smoking. They can assist with free resources such as patches, check-in calls, and counseling.   Ask your family about your grandma and if she has breast cancer or ovarian cancer or both.   Go ahead and get your mammogram.   We will call with results.   Call me if you decide you need the G6PD testing if the [employer] is not going to do this.

## 2019-02-26 ENCOUNTER — Telehealth: Payer: Self-pay

## 2019-02-26 DIAGNOSIS — Z8349 Family history of other endocrine, nutritional and metabolic diseases: Secondary | ICD-10-CM | POA: Insufficient documentation

## 2019-02-26 DIAGNOSIS — Z8041 Family history of malignant neoplasm of ovary: Secondary | ICD-10-CM | POA: Insufficient documentation

## 2019-02-26 DIAGNOSIS — Z832 Family history of diseases of the blood and blood-forming organs and certain disorders involving the immune mechanism: Secondary | ICD-10-CM | POA: Insufficient documentation

## 2019-02-26 LAB — CERVICOVAGINAL ANCILLARY ONLY
Chlamydia: NEGATIVE
Neisseria Gonorrhea: NEGATIVE

## 2019-02-26 LAB — CBC
Hematocrit: 30.9 % — ABNORMAL LOW (ref 34.0–46.6)
Hemoglobin: 9.1 g/dL — ABNORMAL LOW (ref 11.1–15.9)
MCH: 24.3 pg — ABNORMAL LOW (ref 26.6–33.0)
MCHC: 29.4 g/dL — ABNORMAL LOW (ref 31.5–35.7)
MCV: 83 fL (ref 79–97)
Platelets: 421 10*3/uL (ref 150–450)
RBC: 3.74 x10E6/uL — ABNORMAL LOW (ref 3.77–5.28)
RDW: 16.2 % — ABNORMAL HIGH (ref 11.7–15.4)
WBC: 6.9 10*3/uL (ref 3.4–10.8)

## 2019-02-26 LAB — BASIC METABOLIC PANEL
BUN/Creatinine Ratio: 9 (ref 9–23)
BUN: 5 mg/dL — ABNORMAL LOW (ref 6–20)
CO2: 23 mmol/L (ref 20–29)
Calcium: 8.9 mg/dL (ref 8.7–10.2)
Chloride: 105 mmol/L (ref 96–106)
Creatinine, Ser: 0.53 mg/dL — ABNORMAL LOW (ref 0.57–1.00)
GFR calc Af Amer: 138 mL/min/{1.73_m2} (ref >59)
GFR calc non Af Amer: 120 mL/min/{1.73_m2} (ref >59)
Glucose: 84 mg/dL (ref 65–99)
Potassium: 4.2 mmol/L (ref 3.5–5.2)
Sodium: 140 mmol/L (ref 134–144)

## 2019-02-26 LAB — FERRITIN: Ferritin: 4 ng/mL — ABNORMAL LOW (ref 15–150)

## 2019-02-26 MED ORDER — FERROUS SULFATE 324 (65 FE) MG PO TBEC: 1.0000 | DELAYED_RELEASE_TABLET | ORAL | 1 refills | Status: DC

## 2019-02-26 NOTE — Telephone Encounter (Signed)
Patient called nurse line stating she missed a call from Dr. Lindell Noe. I did not see any new notes from today. You can call her or let me know and I will give her a call back with details.

## 2019-02-26 NOTE — Assessment & Plan Note (Signed)
Hx of similar. Recheck CBC today, pica suggests likely recurrence.

## 2019-02-26 NOTE — Assessment & Plan Note (Signed)
Motivated to quit before [employer] enrollment, retry chantix.

## 2019-02-26 NOTE — Telephone Encounter (Signed)
It was in a result note, which is sometimes hard to see. ThaShe does have iron deficiency anemia again. She should take the iron I sent every other day if she can remember, as it is better absorbed that way. If she cannot remember to take it QOD, she can take it daily, and it will work just fine. She can also eat iron containing foods such as leafy greens and red meat. We should recheck in1-2 months.

## 2019-02-26 NOTE — Assessment & Plan Note (Signed)
Referred to genetics to discuss hx of ovarian cancer and breast cancer.

## 2019-02-26 NOTE — Assessment & Plan Note (Signed)
EKG today normal, recommend proceeding w mammo as she has hx of breast cancer in [place]. PAP performed.

## 2019-02-26 NOTE — Assessment & Plan Note (Signed)
Patient reports her father and daughter both have G6PD deficiency.  They are both in the [place] and I suspect they were routinely tested as these patients may require antimalarial prophylaxis which is a known risk factor for hemolysis in a patient with G6PD.  She has not had any symptoms that she knows of.  Her anemia has previously been iron deficiency.  I asked her to discuss this with her Development worker, community and ask whether there would be uniform testing at her enrollment.  We could attempt to test for this.  I am not sure if Medicaid would pay for it or not without known exposure to antimalarial's.

## 2019-02-28 NOTE — Telephone Encounter (Signed)
Called patient from my phone via doximity on Friday afternoon as I was having home connection issues. I explained that she has iron def anemia, can take QOD or QD. Can use colace or miralax for constipation if that occurs. She does note heavy periods that last 7 days, I recommended that we discuss options for this at her next visit.

## 2019-03-01 ENCOUNTER — Other Ambulatory Visit: Payer: Self-pay

## 2019-03-01 ENCOUNTER — Ambulatory Visit
Admission: RE | Admit: 2019-03-01 | Discharge: 2019-03-01 | Disposition: A | Discharge status: Home / Self Care | Payer: Medicaid Other | Source: Ambulatory Visit | Attending: Family Medicine | Admitting: Family Medicine

## 2019-03-01 ENCOUNTER — Telehealth: Payer: Self-pay

## 2019-03-01 DIAGNOSIS — Z1231 Encounter for screening mammogram for malignant neoplasm of breast: Secondary | ICD-10-CM

## 2019-03-01 NOTE — Telephone Encounter (Signed)
Called pt, no answer, no voicemail. Kaitlyn West, CMA

## 2019-03-01 NOTE — Telephone Encounter (Signed)
-----   Message from Sela Hilding, MD sent at 02/26/2019 11:36 AM EDT ----- Called patient, no answer. She does have iron deficiency anemia again. She should take the iron I sent every other day if she can remember, as it is better absorbed that way. If she cannot remember to take it QOD, she can take it daily, and it will work just fine. She can also eat iron containing foods such as leafy greens and red meat. We should recheck in1-2 months.

## 2019-03-02 LAB — CYTOLOGY - PAP
Adequacy: ABSENT
Diagnosis: NEGATIVE
HPV: NOT DETECTED

## 2019-03-02 NOTE — Telephone Encounter (Signed)
Pt returned call and was informed.  She has already picked up the iron and chantix yesterday. Christen Bame, CMA

## 2019-03-02 NOTE — Telephone Encounter (Signed)
Called pt, no answer, left generic VM to call our office. Ottis Stain, CMA

## 2019-03-03 ENCOUNTER — Encounter: Payer: Self-pay | Admitting: Family Medicine

## 2019-03-24 ENCOUNTER — Encounter: Payer: Self-pay | Admitting: Family Medicine

## 2019-03-30 ENCOUNTER — Encounter: Payer: Self-pay | Admitting: Licensed Clinical Social Worker

## 2019-04-08 ENCOUNTER — Telehealth: Payer: Self-pay | Admitting: Licensed Clinical Social Worker

## 2019-04-08 NOTE — Telephone Encounter (Signed)
Called patient regarding upcoming [facility] appointment, left a voicemail and this will be a walk-in visit due to no communication to set up virtual visit.

## 2019-04-12 ENCOUNTER — Inpatient Hospital Stay: Payer: Medicaid Other | Attending: Licensed Clinical Social Worker | Admitting: Licensed Clinical Social Worker

## 2019-04-12 ENCOUNTER — Inpatient Hospital Stay: Payer: Medicaid Other

## 2019-05-03 ENCOUNTER — Ambulatory Visit: Payer: Medicaid Other | Admitting: Family Medicine

## 2019-05-03 ENCOUNTER — Other Ambulatory Visit: Payer: Self-pay

## 2019-05-03 VITALS — BP 112/72 | HR 89 | Wt 152.6 lb

## 2019-05-03 DIAGNOSIS — R600 Localized edema: Secondary | ICD-10-CM

## 2019-05-03 DIAGNOSIS — N920 Excessive and frequent menstruation with regular cycle: Secondary | ICD-10-CM

## 2019-05-03 LAB — POCT UA - MICROSCOPIC ONLY

## 2019-05-03 LAB — POCT URINALYSIS DIP (MANUAL ENTRY)
Bilirubin, UA: NEGATIVE
Glucose, UA: NEGATIVE mg/dL
Ketones, POC UA: NEGATIVE mg/dL
Leukocytes, UA: NEGATIVE
Nitrite, UA: NEGATIVE
Protein Ur, POC: NEGATIVE mg/dL
Spec Grav, UA: 1.025 (ref 1.010–1.025)
Urobilinogen, UA: 0.2 E.U./dL
pH, UA: 5.5 (ref 5.0–8.0)

## 2019-05-03 MED ORDER — NORGESTIMATE-ETH ESTRADIOL 0.25-35 MG-MCG PO TABS: 1.0000 | ORAL_TABLET | Freq: Every day | ORAL | 11 refills | Status: DC

## 2019-05-03 NOTE — Patient Instructions (Signed)
It was a pleasure to see you today! Thank you for choosing [facility] for your primary care. Kaitlyn West was seen for bilateral edema. Come back to the clinic if the edema recurs or in 1 month.  Today we ordered an echo which is a picture of your heart.  Were also going to check your urine for any kidney dysfunction that might be contributing to this.  We think it is too early to you starting this iron pill to recheck your iron levels.  For your complaint of heavy menstrual bleeding which might be contributing to your anemia, we are starting you on a birth control pill.  You did tell [place] that you have had your tubes tied which means that you are not taking this pill for pregnancy reasons.  If you don't hear from [place] in two weeks, please give [place] a call to verify your results. Otherwise, we look forward to seeing you again at your next visit. If you have any questions or concerns before then, please call the clinic at [phone].   Please bring all your medications to every doctors visit   Sign up for My Chart to have easy access to your labs results, and communication with your Primary care physician.     Please check-out at the front desk before leaving the clinic.     Best,  Dr. Sherene Sires FAMILY MEDICINE RESIDENT - PGY2 05/03/2019 2:29 PM

## 2019-05-04 ENCOUNTER — Telehealth: Payer: Self-pay | Admitting: *Deleted

## 2019-05-04 NOTE — Telephone Encounter (Signed)
Follow Up appointment scheduled on 06/04/2019 with PCP. April Zimmerman Rumple, CMA

## 2019-05-04 NOTE — Assessment & Plan Note (Addendum)
Patient was recently sent home from [employer].  She said that while there she started having significant bilateral edema that was painful enough that she could not function.  She said that she was given crutches for this, she said this did start before she was given ibuprofen which she said a physical therapist took her off of a couple days later because it can contribute to swelling and put her on Tylenol instead.  She has had one episode of this back in 2014 which they never determine the etiology of.  She has no known cardiac history, had no changes to her exercise tolerance, no orthopnea.  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  Etiology is not at all evident at this point.  She does have some iron deficiency anemia but was just recently started on iron tablets at the [employer] training so it is too early to bother rechecking.  Will get echo in case there is some contributory factor there although it is low likelihood.  Instructed patient to stay off NSAIDs which she says she has been doing.  Says she is already on a low-salt diet.  There is no indication of edema to the legs on exam today, although she says it was pitting.  Did order UA on chance there was a nephrotic component.  We will reschedule with PCP to reevaluate in a month

## 2019-05-04 NOTE — Assessment & Plan Note (Signed)
Patient is already had a tubal, she says that she has been having heavy periods.  She says is been a long-term issue and she thinks it is been contributing to her iron deficiency anemia.  She has not been taking her iron supplementation regularly until she went to [employer] and they told her she had to restart which she did approximately 2 weeks ago.  She would like to consider hormonal control for her menorrhagia, she request to start birth control pills immediately in case she is unable to reschedule to come back and talk about a longer term option.  We did discuss the unpredictability of these options but she wants to start the pills immediately.  Will schedule her for follow-up with PCP on discharge from clinic today in a month to discuss her bilateral edema, iron deficiency anemia, menorrhagia, echo results

## 2019-05-04 NOTE — Progress Notes (Signed)
Subjective:  Kaitlyn West is a 38 y.o. female who presents to the [facility] today with a chief complaint of bilateral edema.   HPI: Bilateral leg edema Patient was recently sent home from [employer].  She said that while there she started having significant bilateral edema that was painful enough that she could not function.  She said that she was given crutches for this, she said this did start before she was given ibuprofen which she said a physical therapist took her off of a couple days later because it can contribute to swelling and put her on Tylenol instead.  She has had one episode of this back in 2014 which they never determine the etiology of.  She has no known cardiac history, had no changes to her exercise tolerance, no orthopnea.  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   Menorrhagia with regular cycle Patient is already had a tubal, she says that she has been having heavy periods.  She says is been a long-term issue and she thinks it is been contributing to her iron deficiency anemia.  She has not been taking her iron supplementation regularly until she went to [employer] and they told her she had to restart which she did approximately 2 weeks ago.  She would like to consider hormonal control for her menorrhagia, she request to start birth control pills immediately in case she is unable to reschedule to come back and talk about a longer term option.   Objective:  Physical Exam: BP 112/72   Pulse 89   Wt 152 lb 9.6 oz (69.2 kg)   LMP 04/28/2019 (Approximate)   SpO2 99%   BMI 28.83 kg/m   Gen: NAD, resting comfortably CV: RRR with no murmurs appreciated Pulm: NWOB, CTAB with no crackles, wheezes, or rhonchi GI: Normal bowel sounds present. Soft, Nontender, Nondistended. MSK: no edema, cyanosis, or clubbing noted Skin:  warm, dry Neuro: grossly normal, moves all extremities Psych: Normal affect and thought content  Results for orders placed or performed in visit on 05/03/19 (from the past 72 hour(s))  POCT urinalysis dipstick     Status: Abnormal   Collection Time: 05/03/19  2:56 PM  Result Value Ref Range   Color, UA yellow yellow   Clarity, UA clear clear   Glucose, UA negative negative mg/dL   Bilirubin, UA negative negative   Ketones, POC UA negative negative mg/dL   Spec Grav, UA 1.025 1.010 - 1.025   Blood, UA moderate (A) negative   pH, UA 5.5 5.0 - 8.0   Protein Ur, POC negative negative mg/dL   Urobilinogen, UA 0.2 0.2 or 1.0 E.U./dL   Nitrite, UA Negative Negative   Leukocytes, UA Negative Negative  POCT UA - Microscopic Only     Status: None   Collection Time: 05/03/19  4:55 PM  Result Value Ref Range   WBC, Ur, HPF, POC 0-2    RBC, urine, microscopic 1-5    Bacteria, U Microscopic 2+    Epithelial cells, urine per micros 1-6    Crystals, Ur, HPF, POC none    Casts, Ur, LPF, POC none    Yeast, UA none      Assessment/Plan:  Bilateral leg edema Patient was recently sent home from [employer].  She said that while  there she started having significant bilateral edema that was painful enough that she could not function.  She said that she was given crutches for this, she said this did start before she was given ibuprofen which she said a physical therapist took her off of a couple days later because it can contribute to swelling and put her on Tylenol instead.  She has had one episode of this back in 2014 which they never determine the etiology of.  She has no known cardiac history, had no changes to her exercise tolerance, no orthopnea.  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  Etiology is not at all evident at this point.  She does have  some iron deficiency anemia but was just recently started on iron tablets at the [employer] training so it is too early to bother rechecking.  Will get echo in case there is some contributory factor there although it is low likelihood.  Instructed patient to stay off NSAIDs which she says she has been doing.  Says she is already on a low-salt diet.  There is no indication of edema to the legs on exam today, although she says it was pitting.  Did order UA on chance there was a nephrotic component.  We will reschedule with PCP to reevaluate in a month  Menorrhagia with regular cycle Patient is already had a tubal, she says that she has been having heavy periods.  She says is been a long-term issue and she thinks it is been contributing to her iron deficiency anemia.  She has not been taking her iron supplementation regularly until she went to [employer] and they told her she had to restart which she did approximately 2 weeks ago.  She would like to consider hormonal control for her menorrhagia, she request to start birth control pills immediately in case she is unable to reschedule to come back and talk about a longer term option.  We did discuss the unpredictability of these options but she wants to start the pills immediately.  Will schedule her for follow-up with PCP on discharge from clinic today in a month to discuss her bilateral edema, iron deficiency anemia, menorrhagia, echo results   Sherene Sires, [place] - PGY2 05/04/2019 8:09 AM

## 2019-05-04 NOTE — Telephone Encounter (Signed)
-----   Message from Sherene Sires, DO sent at 05/04/2019  7:58 AM EDT ----- Please schedule follow-up with Dr. Maudie Mercury.  Hope is that this would be approximately a month out, reason the visit should be discuss edema in legs (echo results should be back by then), new birth control for menorrhagia, iron deficiency anemia.  Patient should be aware as we discussed this at her clinic visit but it does not look like he got scheduled.  -Dr. Criss Rosales

## 2019-05-06 ENCOUNTER — Other Ambulatory Visit: Payer: Self-pay

## 2019-05-06 ENCOUNTER — Ambulatory Visit (HOSPITAL_COMMUNITY)
Admission: RE | Admit: 2019-05-06 | Discharge: 2019-05-06 | Disposition: A | Discharge status: Home / Self Care | Payer: Medicaid Other | Source: Ambulatory Visit | Attending: Family Medicine | Admitting: Family Medicine

## 2019-05-06 DIAGNOSIS — R600 Localized edema: Secondary | ICD-10-CM

## 2019-05-06 DIAGNOSIS — F17200 Nicotine dependence, unspecified, uncomplicated: Secondary | ICD-10-CM | POA: Diagnosis not present

## 2019-05-06 NOTE — Progress Notes (Signed)
  Echocardiogram 2D Echocardiogram has been performed.  Kaitlyn West 05/06/2019, 2:21 PM

## 2019-05-07 ENCOUNTER — Encounter: Payer: Self-pay | Admitting: Family Medicine

## 2019-06-04 ENCOUNTER — Ambulatory Visit: Payer: Medicaid Other | Admitting: Family Medicine

## 2019-06-04 ENCOUNTER — Encounter: Payer: Self-pay | Admitting: Family Medicine

## 2019-06-04 ENCOUNTER — Other Ambulatory Visit: Payer: Self-pay

## 2019-06-04 VITALS — BP 118/68 | HR 95 | Wt 149.4 lb

## 2019-06-04 DIAGNOSIS — R7989 Other specified abnormal findings of blood chemistry: Secondary | ICD-10-CM | POA: Diagnosis not present

## 2019-06-04 DIAGNOSIS — M67979 Unspecified disorder of synovium and tendon, unspecified ankle and foot: Secondary | ICD-10-CM | POA: Diagnosis not present

## 2019-06-04 DIAGNOSIS — Z833 Family history of diabetes mellitus: Secondary | ICD-10-CM

## 2019-06-04 DIAGNOSIS — Z23 Encounter for immunization: Secondary | ICD-10-CM | POA: Diagnosis not present

## 2019-06-04 LAB — POCT GLYCOSYLATED HEMOGLOBIN (HGB A1C): Hemoglobin A1C: 5 % (ref 4.0–5.6)

## 2019-06-04 MED ORDER — CETIRIZINE HCL 10 MG PO TABS: 10.0000 mg | ORAL_TABLET | Freq: Every day | ORAL | 3 refills | Status: DC

## 2019-06-04 NOTE — Progress Notes (Signed)
  Established Patient - Acute Visit Patient ID: [id number], [birth year]  PCP: Kaitlyn Oliphant, MD  Subjective  CC: Leg Swelling  [id number] Kaitlyn West is a 38 y.o. female with PMHx s/f BLEE from unknown cause, tobacco use who presents today with the following problems:  Follow Up BLEE Patient reports swelling has resolved since last visit with Dr. Criss Rosales. She reports that it has not occurred again and that she has not been as active as she was at [employer]. Patient previously describes the swelling as follows:  Pain Started in July, progressed within a month. 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 Had swelling beginning of August, but then went away after about a month but continued to have pain. Reports having pitting edema. Resolved with putting feet up at night. She reports that when she sat down and then get up, legs felt like bricks. Her ankles were swollen, warm and tender to touch. She also had right knee pain that started at the same time of ankle swelling. Her right knee was not swollen, but constantly aching, worse with bearing weight. Was unable to sit criss cross apple sauce due to swelling and pain. At night, pain went away when swelling went away.   ROS: Pertinent ROS included in HPI. History: Medications, allergies, medical history, family history and social history were reviewed and edited as necessary.  Social Hx: Kaitlyn West reports that she has been smoking cigarettes. She has been smoking about 0.50 packs per day. She has never used smokeless tobacco. She reports that she does not drink alcohol or use drugs.   Objective   Physical Exam:  BP 118/68   Pulse 95   Wt 149 lb 6.4 oz (67.8 kg)   LMP 05/28/2019 (Exact Date)   SpO2 98%   BMI 28.23 kg/m  Filed Weights   06/04/19 0951  Weight: 149 lb 6.4 oz (67.8 kg)   General: NAD, non-toxic, well-appearing, sitting comfortably on exam table.    HEENT: [place]/AT. PERRLA. EOMI.  Cardiovascular: RRR, normal S1, S2. B/L 2+ RP. No BLEE Respiratory: CTAB.  No IWOB.  Abdomen: + BS. NT, ND, soft to palpation.  Extremities: Warm and well perfused. Moving spontaneously. Sginificant tenderness to palpation fingerbreadth posterior of medial mallelous. L > R.  Integumentary: No obvious rashes, lesions, trauma on general exam. Neuro:CN grossly intact. No FND. Gross motor and sensation intact in all four extremities.   Pertinent Labs & Imaging:  No imaging.     Assessment  Tibialis posterior tendinopathy History of overuse, swelling, and pain in pinpoint area make this the most likely cause of pain and previous swelling.  Patient denies fevers, chills, cough  .Patient sent home with return precautions.  - NSAIDs, RICE, rest  - If no improvement, can consider further work up for possible tear and referral to ortho.   Orders Placed This Encounter  Procedures  . Flu Vaccine QUAD 36+ mos IM  . TSH  . HgB A1c   Follow-up: Return if problem recurs,  worsens, or new problem develops.     Kaitlyn West, M.D.  PGY-2  Family Medicine  [phone] 06/07/2019 8:26 PM

## 2019-06-04 NOTE — Progress Notes (Deleted)
Sit down and then get up, legs feel like bricks  Pain Started in July, progressed within a month. 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 Had swelling beginning of August, but then went away after about a month. Continue to have pain. Reports having pitting edema. Resolved with putting feet up at night.   Ankles were swollen, warm and tender to touch. Her knee was not swollen, but constantly aching, worse with bearing weight. Was unable to sit criss cross apple sauce due to swelling and pain. At night, pain went away when swelling went away.

## 2019-06-04 NOTE — Patient Instructions (Signed)
Dear Astrid Drafts,   It was good to see you! Thank you for taking your time to come in to be seen. Today, we discussed the following:   Medial Ankle pain    Keep things from irritating this area.   Come back if you still have ongoing pain that doesn't improve in the next few weeks or if you have swelling again   Thank you for getting your flu shot!! Stay safe!    Please follow up for concerning or worsening symptoms.   Be well,   Zettie Cooley, M.D   [facility] [phone]  *Sign up for MyChart for instant access to your health profile, labs, orders, upcoming appointments or to contact your provider with questions*  ===================================================================================

## 2019-06-05 LAB — TSH: TSH: 0.132 u[IU]/mL — ABNORMAL LOW (ref 0.450–4.500)

## 2019-06-07 DIAGNOSIS — R7989 Other specified abnormal findings of blood chemistry: Secondary | ICD-10-CM | POA: Insufficient documentation

## 2019-06-07 DIAGNOSIS — M67979 Unspecified disorder of synovium and tendon, unspecified ankle and foot: Secondary | ICD-10-CM | POA: Insufficient documentation

## 2019-06-07 NOTE — Assessment & Plan Note (Addendum)
History of overuse, swelling, and pain in pinpoint area make this the most likely cause of pain and previous swelling.  Patient denies fevers, chills, cough  .Patient sent home with return precautions.  - NSAIDs, RICE, rest  - If no improvement, can consider further work up for possible tear and referral to ortho.

## 2019-06-07 NOTE — Assessment & Plan Note (Signed)
Low 0.132. Will call patient and ask her to come back to free t3, t4

## 2019-06-09 ENCOUNTER — Telehealth: Payer: Self-pay | Admitting: *Deleted

## 2019-06-09 NOTE — Telephone Encounter (Signed)
Patient calls nurse line returning call. I informed her of thyroid and scheduled her an apt on 9/11.

## 2019-06-09 NOTE — Telephone Encounter (Signed)
LVM to call office to inform her of below and to assist her in getting a lab appointment scheduled.April Zimmerman Rumple, CMA

## 2019-06-09 NOTE — Telephone Encounter (Signed)
-----   Message from Wilber Oliphant, MD sent at 06/07/2019  8:33 PM EDT ----- TSH lower than previously. Will check free t3 and T4. Please call patient to make a lab appointment. Her A1C was normal. Her A1C was also low. It has been low in the past, but this time a little lower than prior. Please let her know that I will reach out after those tests return for further follow up.

## 2019-06-11 ENCOUNTER — Other Ambulatory Visit: Payer: Self-pay

## 2019-06-11 ENCOUNTER — Other Ambulatory Visit: Payer: Medicaid Other

## 2019-06-12 LAB — T4: T4, Total: 10.8 ug/dL (ref 4.5–12.0)

## 2019-06-12 LAB — T3, FREE: T3, Free: 3.1 pg/mL (ref 2.0–4.4)

## 2019-06-16 NOTE — Addendum Note (Signed)
Addended by: Wilber Oliphant on: 06/16/2019 08:43 AM   Modules accepted: Orders

## 2019-06-18 LAB — T4, FREE: Free T4: 1.26 ng/dL (ref 0.82–1.77)

## 2019-06-18 LAB — T3: T3, Total: 157 ng/dL (ref 71–180)

## 2019-06-18 LAB — SPECIMEN STATUS REPORT

## 2019-11-01 ENCOUNTER — Ambulatory Visit
Admission: EM | Admit: 2019-11-01 | Discharge: 2019-11-01 | Disposition: A | Discharge status: Home / Self Care | Payer: Medicaid Other | Attending: Physician Assistant | Admitting: Physician Assistant

## 2019-11-01 DIAGNOSIS — J029 Acute pharyngitis, unspecified: Secondary | ICD-10-CM

## 2019-11-01 DIAGNOSIS — J3489 Other specified disorders of nose and nasal sinuses: Secondary | ICD-10-CM

## 2019-11-01 DIAGNOSIS — R05 Cough: Secondary | ICD-10-CM

## 2019-11-01 DIAGNOSIS — R000 Tachycardia, unspecified: Secondary | ICD-10-CM

## 2019-11-01 DIAGNOSIS — R059 Cough, unspecified: Secondary | ICD-10-CM

## 2019-11-01 DIAGNOSIS — Z20822 Contact with and (suspected) exposure to covid-19: Secondary | ICD-10-CM

## 2019-11-01 LAB — POCT RAPID STREP A (OFFICE): Rapid Strep A Screen: NEGATIVE

## 2019-11-01 MED ORDER — FLUTICASONE PROPIONATE 50 MCG/ACT NA SUSP: 2.0000 | Freq: Every day | NASAL | 0 refills | Status: DC

## 2019-11-01 MED ORDER — BENZONATATE 100 MG PO CAPS: 100.0000 mg | ORAL_CAPSULE | Freq: Three times a day (TID) | ORAL | 0 refills | Status: DC

## 2019-11-01 NOTE — Discharge Instructions (Addendum)
COVID PCR testing ordered. I would like you to quarantine until testing results. Tessalon for cough. Flonase for nasal congestion/drainage. Tylenol/motrin for pain and fever. Keep hydrated, urine should be clear to pale yellow in color. If experiencing shortness of breath, trouble breathing, go to the emergency department for further evaluation needed.  

## 2019-11-01 NOTE — ED Triage Notes (Signed)
Pt c/o cough, congestion and sore throat since Saturday. States her daughter is strep positive.

## 2019-11-01 NOTE — ED Provider Notes (Signed)
[facility]    CSN: [id number] Arrival date & time: 11/01/19  [id number]      History   Chief Complaint Chief Complaint  Patient presents with  . Cough    HPI Kaitlyn West is a 39 y.o. female.   39 year old female comes in for 3 day history of URI symptoms. Has had cough, congestion, sore throat, body aches. Denies fever, chills. Denies abdominal pain, nausea, vomiting, diarrhea. Denies shortness of breath, loss of taste/smell. Daughter is positive for strep.      Past Medical History:  Diagnosis Date  . Acne 01/22/2011  . Anemia   . Headache   . Reflux     Patient Active Problem List   Diagnosis Date Noted  . Tibialis posterior tendinopathy 06/07/2019  . Decreased thyroid stimulating hormone (TSH) level 06/07/2019  . Family hx of ovarian malignancy 02/26/2019  . Family history of G6PD 02/26/2019  . Migraine 04/01/2013  . Health care maintenance 04/01/2013  . IDA (iron deficiency anemia) 10/07/2008  . SMOKER 10/07/2008  . Menorrhagia with regular cycle 10/07/2008    Past Surgical History:  Procedure Laterality Date  . CHOLECYSTECTOMY    . TUBAL LIGATION      OB History   No obstetric history on file.      Home Medications    Prior to Admission medications   Medication Sig Start Date End Date Taking? Authorizing Provider  benzonatate (TESSALON) 100 MG capsule Take 1-2 capsules (100-200 mg total) by mouth every 8 (eight) hours. 11/01/19   Tasia Catchings, Alizabeth Antonio V, PA-C  fluticasone (FLONASE) 50 MCG/ACT nasal spray Place 2 sprays into both nostrils daily. 11/01/19   Ok Edwards, PA-C    Family History Family History  Problem Relation Age of Onset  . Migraines Mother   . Diabetes Mellitus II Mother   . Migraines Brother   . Migraines Daughter   . Diabetes Mellitus II Father     Social History Social History   Tobacco Use  . Smoking status: Current Every Day Smoker    Packs/day: 0.50    Types: Cigarettes  . Smokeless tobacco: Never Used  Substance Use  Topics  . Alcohol use: No  . Drug use: No     Allergies   Pistachio nut (diagnostic) and Shellfish allergy   Review of Systems Review of Systems  Reason unable to perform ROS: See HPI as above.     Physical Exam Triage Vital Signs ED Triage Vitals [11/01/19 1007]  Enc Vitals Group     BP 102/70     Pulse Rate (!) 115     Resp 18     Temp 99.6 F (37.6 C)     Temp Source Oral     SpO2 98 %     Weight      Height      Head Circumference      Peak Flow      Pain Score 4     Pain Loc      Pain Edu?      Excl. in [place]?    No data found.  Updated Vital Signs BP 102/70 (BP Location: Left Arm)   Pulse (!) 115   Temp 99.6 F (37.6 C) (Oral)   Resp 18   LMP 10/04/2019   SpO2 98%   Physical Exam Constitutional:      General: She is not in acute distress.    Appearance: Normal appearance. She is not ill-appearing, toxic-appearing or diaphoretic.  HENT:     Head: Normocephalic and atraumatic.     Mouth/Throat:     Mouth: Mucous membranes are moist.     Pharynx: Oropharynx is clear. Uvula midline.     Tonsils: No tonsillar exudate.  Cardiovascular:     Rate and Rhythm: Regular rhythm. Tachycardia present.     Heart sounds: Normal heart sounds. No murmur. No friction rub. No gallop.   Pulmonary:     Effort: Pulmonary effort is normal. No accessory muscle usage, prolonged expiration, respiratory distress or retractions.     Comments: Lungs clear to auscultation without adventitious lung sounds. Musculoskeletal:     Cervical back: Normal range of motion and neck supple.  Neurological:     General: No focal deficit present.     Mental Status: She is alert and oriented to person, place, and time.     Comments: Able to ambulate on own without difficulty.      UC Treatments / Results  Labs (all labs ordered are listed, but only abnormal results are displayed) Labs Reviewed  CULTURE, GROUP A STREP ([place])  NOVEL CORONAVIRUS, NAA  POCT RAPID STREP A (OFFICE)     EKG   Radiology No results found.  Procedures Procedures (including critical care time)  Medications Ordered in UC Medications - No data to display  Initial Impression / Assessment and Plan / UC Course  I have reviewed the triage vital signs and the nursing notes.  Pertinent labs & imaging results that were available during my care of the patient were reviewed by me and considered in my medical decision making (see chart for details).    Patient tachycardic at triage and exam. BP 102/70, which is normal for her. She does have intermittent dizziness with ambulation at times. Able to ambulate on own without difficulty. No alarming signs on exam at this time. Discussed to push fluids and continue to monitor, if worsening, may need IV fluids. Patient expresses understanding.  Strep negative. COVID PCR test ordered. Patient to quarantine until testing results return. Symptomatic treatment discussed.  Return precautions given.  Patient expresses understanding and agrees to plan.  Final Clinical Impressions(s) / UC Diagnoses   Final diagnoses:  Cough  Rhinorrhea  Sore throat   ED Prescriptions    Medication Sig Dispense Auth. Provider   benzonatate (TESSALON) 100 MG capsule Take 1-2 capsules (100-200 mg total) by mouth every 8 (eight) hours. 30 capsule Trendon Zaring V, PA-C   fluticasone (FLONASE) 50 MCG/ACT nasal spray Place 2 sprays into both nostrils daily. 1 g Ok Edwards, PA-C     PDMP not reviewed this encounter.   Ok Edwards, PA-C 11/01/19 1038

## 2019-11-02 LAB — NOVEL CORONAVIRUS, NAA: SARS-CoV-2, NAA: NOT DETECTED

## 2019-11-03 LAB — CULTURE, GROUP A STREP (THRC)

## 2020-01-06 ENCOUNTER — Ambulatory Visit: Payer: Medicaid Other | Attending: Internal Medicine

## 2020-01-06 DIAGNOSIS — Z23 Encounter for immunization: Secondary | ICD-10-CM

## 2020-01-06 NOTE — Progress Notes (Signed)
   Covid-19 Vaccination Clinic  Name:  Kaitlyn West    MRN: [id number] DOB: 05/16/1981  01/06/2020  [name] was observed post Covid-19 immunization for 15 minutes without incident. She was provided with Vaccine Information Sheet and instruction to access the V-Safe system.   [name] was instructed to call 911 with any severe reactions post vaccine: Marland Kitchen Difficulty breathing  . Swelling of face and throat  . A fast heartbeat  . A bad rash all over body  . Dizziness and weakness   Immunizations Administered    Name Date Dose VIS Date Route   Pfizer COVID-19 Vaccine 01/06/2020 11:58 AM 0.3 mL 09/10/2019 Intramuscular   Manufacturer: [place]   Lot: [id number]   [place]: [id number]

## 2020-01-31 ENCOUNTER — Ambulatory Visit: Payer: Medicaid Other | Attending: Internal Medicine

## 2020-01-31 ENCOUNTER — Ambulatory Visit: Payer: Self-pay

## 2020-01-31 DIAGNOSIS — Z23 Encounter for immunization: Secondary | ICD-10-CM

## 2020-01-31 NOTE — Progress Notes (Signed)
   Covid-19 Vaccination Clinic  Name:  Kaitlyn West    MRN: [id number] DOB: 1981-05-06  01/31/2020  [name] was observed post Covid-19 immunization for 15 minutes without incident. She was provided with Vaccine Information Sheet and instruction to access the V-Safe system.   [name] was instructed to call 911 with any severe reactions post vaccine: Marland Kitchen Difficulty breathing  . Swelling of face and throat  . A fast heartbeat  . A bad rash all over body  . Dizziness and weakness   Immunizations Administered    Name Date Dose VIS Date Route   Pfizer COVID-19 Vaccine 01/31/2020  9:02 AM 0.3 mL 11/24/2018 Intramuscular   Manufacturer: [place]   Lot: [id number]   [place]: [id number]

## 2020-03-03 ENCOUNTER — Encounter: Payer: Self-pay | Admitting: Family Medicine

## 2020-03-03 ENCOUNTER — Ambulatory Visit (INDEPENDENT_AMBULATORY_CARE_PROVIDER_SITE_OTHER): Payer: Medicaid Other | Admitting: Family Medicine

## 2020-03-03 ENCOUNTER — Other Ambulatory Visit: Payer: Self-pay

## 2020-03-03 VITALS — BP 116/72 | HR 103 | Ht 61.0 in | Wt 152.2 lb

## 2020-03-03 DIAGNOSIS — Z114 Encounter for screening for human immunodeficiency virus [HIV]: Secondary | ICD-10-CM

## 2020-03-03 DIAGNOSIS — R7989 Other specified abnormal findings of blood chemistry: Secondary | ICD-10-CM

## 2020-03-03 DIAGNOSIS — F17200 Nicotine dependence, unspecified, uncomplicated: Secondary | ICD-10-CM | POA: Diagnosis not present

## 2020-03-03 DIAGNOSIS — Z0001 Encounter for general adult medical examination with abnormal findings: Secondary | ICD-10-CM

## 2020-03-03 DIAGNOSIS — N920 Excessive and frequent menstruation with regular cycle: Secondary | ICD-10-CM

## 2020-03-03 DIAGNOSIS — D508 Other iron deficiency anemias: Secondary | ICD-10-CM

## 2020-03-03 MED ORDER — POLYETHYLENE GLYCOL 3350 17 GM/SCOOP PO POWD: 17.0000 g | Freq: Two times a day (BID) | ORAL | 1 refills | Status: DC | PRN

## 2020-03-03 NOTE — Assessment & Plan Note (Signed)
Patient is not ready to quit just yet.  She is aware that we are available should she decide that she want to quit.

## 2020-03-03 NOTE — Assessment & Plan Note (Signed)
Repeat TSH and T4 today.  Patient asymptomatic.

## 2020-03-03 NOTE — Patient Instructions (Addendum)
Dear Kaitlyn West,   It was good to see you! Thank you for taking your time to come in to be seen. Today, we discussed the following:   Use compression stockings for the swelling in your legs. Let us know if you want any help quitting smoking Follow-up in 1 year for your next annual visit or sooner as needed For constipation, I have sent you a medication called MiraLAX.  Take this twice a day until you are having a regular bowel movement.  You can stop taking it once you start having regular bowel movements.  Or you can take it daily if it helps with regular bowel movements.  You can also try increasing the amount of fiber or getting Metamucil over-the-counter.  Let us know if you have any further issues with constipation.  For all labs obtained today, I will send results via [place].  If anything is abnormal, we will call you for details on further management.  Be well,   Zettie Cooley, M.D   [facility] [phone]  *Sign up for MyChart for instant access to your health profile, labs, orders, upcoming appointments or to contact your provider with questions*  ===================================================================================

## 2020-03-03 NOTE — Assessment & Plan Note (Signed)
Patient reports that she stopped taking iron within the last year.  She was chewing ice and has stopped chewing ice which she thought was the cause of her iron deficiency.  We will recheck ferritin and CBC today.  She denies any lightheadedness, dizziness.  Patient also reports that she has heavy menstrual cycles having to use tampon and pad on her heaviest days and at night.  This could also be contributing to her anemia.  She has tried birth control and stopped taking it as it was making her menstrual cycle irregular.

## 2020-03-03 NOTE — Assessment & Plan Note (Signed)
Checking CBC today.  Patient discontinued her birth control.

## 2020-03-03 NOTE — Progress Notes (Signed)
Established Patient - Annual Preventative Physical Subjective  Patient ID: MRN [id number]  Date of birth: [birth year]   PCP: Wilber Oliphant, MD  CC: Annual Preventative Physical   FFM:BWGYK P Kaitlyn West is a 39 y.o. female.   Constipation: BM every 3-4 days.   Anemia Taking iron supplements at home. Hgb 9.1 and ferritin 4   HISTORY Medications, allergies, medical history, family history and social history were reviewed and edited as necessary. Pertinent findings included in HPI.  Pertinent Changes in History:  Medical: none Surgical: none  Family: none   Social:  Smoking 1/2 PPD, no ETOH, no drug use. 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   Kenli reports that she has been smoking cigarettes. She has been smoking about 0.50 packs per day. She has never used smokeless tobacco. She reports that she does not drink alcohol or use drugs. ROS: See HPI  OB:  Normal monthly periods. 7 days. Heavy bleeding on 2-5th day. Changes about q 2 hours. At night uses and tampon and pad. She also does this on her heavier days. Was trying birth control but not helping.  Sexually active.  S/p Tubal ligation   Objective  Physical Exam:  BP 116/72   Pulse (!) 103   Ht 5\' 1"  (1.549 m)   Wt 152 lb 3.2 oz (69 kg)   LMP 03/03/2020 (Exact Date)   SpO2 100%   BMI 28.76 kg/m  Gen: NAD, alert, non-toxic, well-nourished, well-appearing, pleasant HEENT: Normocephaic, atraumatic. PERRLA, clear conjuctiva, no scleral icterus and injection. Normal EOM.  Hearing intact. TM pearly grey bilaterally with no fluid.  Neck supple with no LAD, nodules, or gross abnormality.  Nares patent with no discharge.  Maxillary and frontal sinuses nontender to palpation.  Oropharynx without erythema and lesions.  Tonsils nonswollen and without exudate.   CV: Regular rate and rhythm.  Normal S1-S2.  No murmur, gallops, S3, S4 appreciated.  Normal capillary refill bilaterally.  Radial pulses 2+ bilaterally. No bilateral lower  extremity edema. Resp: Clear to auscultation bilaterally.  No wheezing, rales, rhonchi, or other abnormal lung sounds.  No increased work of breathing appreciated. Abd: Nontender and nondistended on palpation to all 4 quadrants.  Positive bowel sounds. Skin: No obvious rashes, lesions, or trauma.  Normal turgor.  MSK: Normal ROM. Normal strength and tone.  Neuro: Cranial nerves II through VI grossly intact. Gait normal.  Alert and oriented x4.  No obvious abnormal movements. Psych: Cooperative with exam.  Normal speech. Pleasant. Makes good eye contact. Genitourinary: deferred.   Assessment & Plan  IDA (iron deficiency anemia) Patient reports that she stopped taking iron within the last year.  She was chewing ice and has stopped chewing ice which she thought was the cause of her iron deficiency.  We will recheck ferritin and CBC today.  She denies any lightheadedness, dizziness.  Patient also reports that she has heavy menstrual cycles having to use tampon and pad on her heaviest days and at night.  This could also be contributing to her anemia.  She has tried birth control and stopped taking it as it was making her menstrual cycle irregular.  SMOKER Patient is not ready to quit just yet.  She is aware that we are available should she decide that she want to quit.  Menorrhagia with regular cycle Checking CBC today.  Patient discontinued her birth control.  Decreased thyroid stimulating hormone (TSH) level Repeat TSH and T4 today.  Patient asymptomatic.   There are no  preventive care reminders to display for this patient. Health Maintenance discussed with patient and patient agrees to address when able.   Follow-up:  Future Appointments  Date Time Provider [place]  03/06/2020 12:20 PM [facility]    @[facility] @

## 2020-03-04 LAB — TSH+FREE T4
Free T4: 1.21 ng/dL (ref 0.82–1.77)
TSH: 0.202 u[IU]/mL — ABNORMAL LOW (ref 0.450–4.500)

## 2020-03-04 LAB — FERRITIN: Ferritin: 10 ng/mL — ABNORMAL LOW (ref 15–150)

## 2020-03-04 LAB — CBC
Hematocrit: 32.1 % — ABNORMAL LOW (ref 34.0–46.6)
Hemoglobin: 10.2 g/dL — ABNORMAL LOW (ref 11.1–15.9)
MCH: 28 pg (ref 26.6–33.0)
MCHC: 31.8 g/dL (ref 31.5–35.7)
MCV: 88 fL (ref 79–97)
Platelets: 363 10*3/uL (ref 150–450)
RBC: 3.64 x10E6/uL — ABNORMAL LOW (ref 3.77–5.28)
RDW: 13.6 % (ref 11.7–15.4)
WBC: 7.9 10*3/uL (ref 3.4–10.8)

## 2020-03-04 LAB — HIV ANTIBODY (ROUTINE TESTING W REFLEX): HIV Screen 4th Generation wRfx: NONREACTIVE

## 2020-03-06 ENCOUNTER — Ambulatory Visit
Admission: RE | Admit: 2020-03-06 | Discharge: 2020-03-06 | Disposition: A | Discharge status: Home / Self Care | Payer: Medicaid Other | Source: Ambulatory Visit | Attending: Family Medicine | Admitting: Family Medicine

## 2020-03-06 ENCOUNTER — Other Ambulatory Visit: Payer: Self-pay

## 2020-03-06 DIAGNOSIS — E2839 Other primary ovarian failure: Secondary | ICD-10-CM

## 2020-03-06 MED ORDER — FERROUS FUMARATE 325 (106 FE) MG PO TABS: 1.0000 | ORAL_TABLET | Freq: Two times a day (BID) | ORAL | 11 refills | Status: DC

## 2020-03-06 NOTE — Addendum Note (Signed)
Addended by: Zettie Cooley E on: 03/06/2020 02:06 PM   Modules accepted: Orders

## 2020-12-08 ENCOUNTER — Other Ambulatory Visit: Payer: Self-pay

## 2020-12-08 ENCOUNTER — Encounter: Payer: Self-pay | Admitting: Family Medicine

## 2020-12-08 ENCOUNTER — Other Ambulatory Visit (HOSPITAL_COMMUNITY)
Admission: RE | Admit: 2020-12-08 | Discharge: 2020-12-08 | Disposition: A | Discharge status: Home / Self Care | Payer: Medicaid Other | Source: Ambulatory Visit | Attending: Family Medicine | Admitting: Family Medicine

## 2020-12-08 ENCOUNTER — Ambulatory Visit (INDEPENDENT_AMBULATORY_CARE_PROVIDER_SITE_OTHER): Payer: Medicaid Other | Admitting: Family Medicine

## 2020-12-08 VITALS — BP 104/56 | HR 86 | Wt 152.4 lb

## 2020-12-08 DIAGNOSIS — D509 Iron deficiency anemia, unspecified: Secondary | ICD-10-CM | POA: Diagnosis not present

## 2020-12-08 DIAGNOSIS — R350 Frequency of micturition: Secondary | ICD-10-CM

## 2020-12-08 DIAGNOSIS — N923 Ovulation bleeding: Secondary | ICD-10-CM | POA: Insufficient documentation

## 2020-12-08 DIAGNOSIS — Z113 Encounter for screening for infections with a predominantly sexual mode of transmission: Secondary | ICD-10-CM

## 2020-12-08 DIAGNOSIS — N841 Polyp of cervix uteri: Secondary | ICD-10-CM | POA: Diagnosis not present

## 2020-12-08 LAB — POCT WET PREP (WET MOUNT)
Clue Cells Wet Prep Whiff POC: NEGATIVE
Trichomonas Wet Prep HPF POC: ABSENT

## 2020-12-08 LAB — POCT URINALYSIS DIP (MANUAL ENTRY)
Bilirubin, UA: NEGATIVE
Glucose, UA: NEGATIVE mg/dL
Ketones, POC UA: NEGATIVE mg/dL
Leukocytes, UA: NEGATIVE
Nitrite, UA: POSITIVE — AB
Protein Ur, POC: NEGATIVE mg/dL
Spec Grav, UA: >=1.03 — AB (ref 1.010–1.025)
Urobilinogen, UA: 0.2 E.U./dL
pH, UA: 5.5 (ref 5.0–8.0)

## 2020-12-08 LAB — POCT UA - MICROSCOPIC ONLY

## 2020-12-08 LAB — POCT URINE PREGNANCY: Preg Test, Ur: NEGATIVE

## 2020-12-08 NOTE — Progress Notes (Signed)
SUBJECTIVE:   CHIEF COMPLAINT / HPI:   Spotting after menses- has been for the past two months. After period in January, stopped menses for about a week then started having dark scant spotting for three days then stopped. Then this past month, LMP feb 25th, stopped bleeding for several days then started having darker spotting again and how has been going on for a week. Previously had normal periods, no history of anovulation or irregular periods. Had some abdominal cramping the first day she started spotting about a week ago but none since. Not taking any form of hormonal contraception in past 6 months, has tubes tied. Never had anything like this before. Some increased urinary frequency in the past week, no blood in urine or stool, no fevers, no chills, no vaginal discharge or pruritis or dysuria (just the darker brown spotting). Has one partner for the past 8 months, feels safe in the relationship and denies IPV. Last intercourse two days ago, notes a "popping" noise and some pain with intercourse that both she and her partner can feel, but the pain is not unbearable. Notes some vaginal dryness as well, occasional night sweats but not regularly. No history of abnormal pap smears. Not really having postcoital bleeding, but notes more of this bleeding after intercourse.  History of STIs? Remote history of gonorrhea and chlamydia in 2007 Last pap smear: uptodate, 02/25/2019 NILM cells absent HPV negative But also 06/24/2016- HPV negative, NILM, cells present  Iron deficiency anemia- baseline 9-10 per chart review, had been on PO iron but is currently not taking, had suspected was due to menorrhagia. Had previously trialed oral OCPs but not in the past eight months. Denies family history of colon cancer, denies noting blood in her stool.  PERTINENT  PMH / PSH: iron deficiency anemia  OBJECTIVE:   BP (!) 104/56   Pulse 86   Wt 152 lb 6.4 oz (69.1 kg)   LMP 11/23/2020   SpO2 99%   BMI 28.80 kg/m    General: A&O, NAD HEENT: No sign of trauma, EOM grossly intact Cardiac: RRR, no m/r/g Respiratory: CTAB, normal WOB, no w/c/r GI: Soft, NTTP, non-distended  Extremities: NTTP, no peripheral edema. Neuro: Normal gait, moves all four extremities appropriately. Psych: Appropriate mood and affect  GU: Normal appearance of labia majora and minora, without lesions. urethra without abnormality. Vagina tissue pink, moist, without lesions or abrasions. Cervix non-friable, with anterior lip continually obstructing view of os but without sign of pedunculation or ulceration, scant dark brown bleeding noted coming from cervical os. No abrasions, lacerations, or other trauma appreciated. Negative cervical motion tenderness. On bimanual exam, no adnexal masses, but 2-3 cm soft non-tender, nonpenduculated anterior cervical mass appreciated on bimanual exam.   Eusebio Friendly CMA present as chaperone for pelvic exam.  ASSESSMENT/PLAN:   Intermenstrual bleeding - for the past two months, currently a week of spotting after about 5 days ended from her LMP, on exam concern for cervical polyp vs mass on anterior cervical lip, but bleeding noted to be coming from os, no lacerations or cervical friability noted - will order TVUS to assess - STI check today will call/message with results  Cervical polyp - concern for anterior cervical polyp vs mass is it is not pedunculated and is soft and non-friable, but patient with intermenstrual bleeding worse after intercourse and also pain and "popping" noise noted with intercourse - Pap smear UTD and normal - referral to OB/GYN for further evaluation, discussed with patient today who  is in agreement  IDA (iron deficiency anemia) - not currently taking PO iron, will repeat CBC today - pt denies family history of colon cancer or early colon cancer, also denies rectal bleeding, discussed that iron deficiency could be due solely to heavy menses but would recommend continued  discussion of GI referral for colonoscopy to look for GI causes as well, pt considering and will await CBC results.     Lenoria Chime, MD [place]

## 2020-12-08 NOTE — Patient Instructions (Addendum)
It was wonderful to see you today.  Please bring ALL of your medications with you to every visit.   Today we talked about:  - Testing for causes of bleeding including sexually transmitted infections, will send MyChart message if negative or call you with results - A pelvic ultrasound has been scheduled for you, will call you with results - We have sent a referral to OB gyn to assess the possible cervical polyp noted on exam today, they can discuss your intermenstrual bleeding with you as well - Please schedule a follow-up in one month so we can check how you are doing and make sure you have all follow-up appointments you need   Thank you for choosing [facility].   Please call [phone] with any questions about today's appointment.  Please be sure to schedule follow up at the front  desk before you leave today.   Yehuda Savannah, MD  Family Medicine

## 2020-12-08 NOTE — Assessment & Plan Note (Signed)
-   concern for anterior cervical polyp vs mass is it is not pedunculated and is soft and non-friable, but patient with intermenstrual bleeding worse after intercourse and also pain and "popping" noise noted with intercourse - Pap smear UTD and normal - referral to OB/GYN for further evaluation, discussed with patient today who is in agreement

## 2020-12-08 NOTE — Assessment & Plan Note (Signed)
-   for the past two months, currently a week of spotting after about 5 days ended from her LMP, on exam concern for cervical polyp vs mass on anterior cervical lip, but bleeding noted to be coming from os, no lacerations or cervical friability noted - will order TVUS to assess - STI check today will call/message with results

## 2020-12-08 NOTE — Assessment & Plan Note (Signed)
-   not currently taking PO iron, will repeat CBC today - pt denies family history of colon cancer or early colon cancer, also denies rectal bleeding, discussed that iron deficiency could be due solely to heavy menses but would recommend continued discussion of GI referral for colonoscopy to look for GI causes as well, pt considering and will await CBC results.

## 2020-12-09 ENCOUNTER — Telehealth: Payer: Self-pay | Admitting: Family Medicine

## 2020-12-09 DIAGNOSIS — D509 Iron deficiency anemia, unspecified: Secondary | ICD-10-CM

## 2020-12-09 LAB — CBC
Hematocrit: 30.1 % — ABNORMAL LOW (ref 34.0–46.6)
Hemoglobin: 8.9 g/dL — ABNORMAL LOW (ref 11.1–15.9)
MCH: 24.5 pg — ABNORMAL LOW (ref 26.6–33.0)
MCHC: 29.6 g/dL — ABNORMAL LOW (ref 31.5–35.7)
MCV: 83 fL (ref 79–97)
Platelets: 468 10*3/uL — ABNORMAL HIGH (ref 150–450)
RBC: 3.63 x10E6/uL — ABNORMAL LOW (ref 3.77–5.28)
RDW: 16.5 % — ABNORMAL HIGH (ref 11.7–15.4)
WBC: 7.8 10*3/uL (ref 3.4–10.8)

## 2020-12-09 LAB — RPR: RPR Ser Ql: NONREACTIVE

## 2020-12-09 LAB — HIV ANTIBODY (ROUTINE TESTING W REFLEX): HIV Screen 4th Generation wRfx: NONREACTIVE

## 2020-12-09 MED ORDER — FERROUS SULFATE 325 (65 FE) MG PO TBEC
325.0000 mg | DELAYED_RELEASE_TABLET | ORAL | 3 refills | Status: DC
Start: 2020-12-09 — End: 2020-12-09

## 2020-12-09 MED ORDER — FERROUS SULFATE 325 (65 FE) MG PO TBEC: 325.0000 mg | DELAYED_RELEASE_TABLET | ORAL | 3 refills | Status: DC

## 2020-12-09 NOTE — Telephone Encounter (Signed)
Called and spoke with Kaitlyn West, discussed Hgb 8.9 and needing to restart PO iron. She notes she has just had a little blood when she wiped today. She denies fatigue, chest pain, dyspnea on exertion, dizziness, or syncope/presycnope- but we discussed today that if she starts having any of these symptoms she needs to call or message and let us know, or if she has worsening bleeding with large clots or increased flow. I suspect her thrombocytosis is due to her anemia as well.  Discussed urinalysis showed + nitrites but without a lot of bacteria, urine culture is pending. She is not having increased urinary frequency today, nor is she having dysuria or pruritis. Discussed that since she is not having symptoms we can wait for culture results.   Answered all questions and concerns, she has follow-up with our clinic and an [place] scheduled.  Yehuda Savannah MD

## 2020-12-10 LAB — CERVICOVAGINAL ANCILLARY ONLY
Chlamydia: NEGATIVE
Comment: NEGATIVE
Comment: NEGATIVE
Comment: NORMAL
Neisseria Gonorrhea: NEGATIVE
Trichomonas: NEGATIVE

## 2020-12-11 ENCOUNTER — Telehealth: Payer: Self-pay | Admitting: Family Medicine

## 2020-12-11 DIAGNOSIS — N3000 Acute cystitis without hematuria: Secondary | ICD-10-CM

## 2020-12-11 MED ORDER — CEPHALEXIN 500 MG PO CAPS: 500.0000 mg | ORAL_CAPSULE | Freq: Four times a day (QID) | ORAL | 0 refills | Status: AC

## 2020-12-11 MED ORDER — FLUCONAZOLE 150 MG PO TABS: 150.0000 mg | ORAL_TABLET | Freq: Once | ORAL | 0 refills | Status: AC

## 2020-12-11 NOTE — Telephone Encounter (Signed)
Called and spoke with Kaitlyn West, discussed urine culture growing E coli, she notes she is still having urinary frequency, denies dysuria or other symptoms. Denies any allergies to antibiotics. Will send Keflex x 5 days to pharmacy.   Discussed STI testing negative for GC/Chlamydia, Trichomonas. Reminded her that iron sent to pharmacy already over the weekend as well.  Yehuda Savannah MD

## 2020-12-11 NOTE — Telephone Encounter (Signed)
Diflucan also sent to pharmacy per patient request.

## 2020-12-11 NOTE — Addendum Note (Signed)
Addended by: Yehuda Savannah E on: 12/11/2020 11:37 AM   Modules accepted: Orders

## 2020-12-12 LAB — URINE CULTURE

## 2020-12-13 ENCOUNTER — Telehealth: Payer: Self-pay

## 2020-12-13 NOTE — Telephone Encounter (Signed)
Patient LVM on nurse line, however the VM was muffled and cut in and out. I called patient back to get more information, however I had to LVM asking her to return my call.

## 2020-12-15 ENCOUNTER — Telehealth: Payer: Self-pay

## 2020-12-15 ENCOUNTER — Other Ambulatory Visit: Payer: Self-pay

## 2020-12-15 ENCOUNTER — Emergency Department (HOSPITAL_COMMUNITY)
Admission: EM | Admit: 2020-12-15 | Discharge: 2020-12-16 | Disposition: A | Discharge status: Home / Self Care | Payer: Medicaid Other | Attending: Emergency Medicine | Admitting: Emergency Medicine

## 2020-12-15 ENCOUNTER — Encounter (HOSPITAL_COMMUNITY): Payer: Self-pay | Admitting: Emergency Medicine

## 2020-12-15 DIAGNOSIS — N939 Abnormal uterine and vaginal bleeding, unspecified: Secondary | ICD-10-CM | POA: Diagnosis not present

## 2020-12-15 DIAGNOSIS — Z5321 Procedure and treatment not carried out due to patient leaving prior to being seen by health care provider: Secondary | ICD-10-CM | POA: Diagnosis not present

## 2020-12-15 LAB — CBC WITH DIFFERENTIAL/PLATELET
Abs Immature Granulocytes: 0.01 10*3/uL (ref 0.00–0.07)
Basophils Absolute: 0 10*3/uL (ref 0.0–0.1)
Basophils Relative: 1 %
Eosinophils Absolute: 0.1 10*3/uL (ref 0.0–0.5)
Eosinophils Relative: 2 %
HCT: 29.9 % — ABNORMAL LOW (ref 36.0–46.0)
Hemoglobin: 8.8 g/dL — ABNORMAL LOW (ref 12.0–15.0)
Immature Granulocytes: 0 %
Lymphocytes Relative: 26 %
Lymphs Abs: 1.6 10*3/uL (ref 0.7–4.0)
MCH: 24.6 pg — ABNORMAL LOW (ref 26.0–34.0)
MCHC: 29.4 g/dL — ABNORMAL LOW (ref 30.0–36.0)
MCV: 83.5 fL (ref 80.0–100.0)
Monocytes Absolute: 0.5 10*3/uL (ref 0.1–1.0)
Monocytes Relative: 8 %
Neutro Abs: 4 10*3/uL (ref 1.7–7.7)
Neutrophils Relative %: 63 %
Platelets: 447 10*3/uL — ABNORMAL HIGH (ref 150–400)
RBC: 3.58 MIL/uL — ABNORMAL LOW (ref 3.87–5.11)
RDW: 18.4 % — ABNORMAL HIGH (ref 11.5–15.5)
WBC: 6.2 10*3/uL (ref 4.0–10.5)
nRBC: 0 % (ref 0.0–0.2)

## 2020-12-15 NOTE — ED Triage Notes (Signed)
Pt. Stated, Kaitlyn West had bleeding since my last period. I saw my Dr. Maryjane Hurter week and she said I had a polyp and Im suppose to have [place] on Monday.

## 2020-12-15 NOTE — ED Triage Notes (Signed)
Pt. Saw Dr. Thompson Grayer and he took blood work and did a vaginal exam. And said my HGB was low.

## 2020-12-15 NOTE — ED Triage Notes (Signed)
Pt. Stated, Im on an antibiotic for E-coli

## 2020-12-15 NOTE — Telephone Encounter (Signed)
Agree with ED evaluation due to increasing bleeding, going through tampon every 2-3 hours, and no afternoon appointments available at our clinic.

## 2020-12-15 NOTE — Telephone Encounter (Signed)
Patient calls nurse line regarding vaginal bleeding. Approx 4 days ago, bleeding changed from brown to bright red. Also reports passing one dime sized blood clot. Patient is wearing ultra tampon and pad. Patient reports that she is changing approx every 2-3 hours.   Patient denies lightheadedness or dizziness. Spoke with Dr. Thompson Grayer regarding patient. Due to increase in bleeding and previously low hgb levels, provider is recommending ED evaluation. Informed patient of provider recommendation. Patient verbalizes understanding.   Talbot Grumbling, RN

## 2020-12-16 NOTE — ED Notes (Signed)
Called pts name 3x for updated VS with no response. Pt not seen in lobby at this time.

## 2020-12-18 ENCOUNTER — Other Ambulatory Visit: Payer: Self-pay

## 2020-12-18 ENCOUNTER — Ambulatory Visit
Admission: RE | Admit: 2020-12-18 | Discharge: 2020-12-18 | Disposition: A | Discharge status: Home / Self Care | Payer: Medicaid Other | Source: Ambulatory Visit | Attending: Family Medicine | Admitting: Family Medicine

## 2020-12-18 DIAGNOSIS — N923 Ovulation bleeding: Secondary | ICD-10-CM | POA: Insufficient documentation

## 2020-12-18 DIAGNOSIS — N841 Polyp of cervix uteri: Secondary | ICD-10-CM | POA: Diagnosis present

## 2020-12-19 ENCOUNTER — Telehealth: Payer: Self-pay | Admitting: Family Medicine

## 2020-12-19 NOTE — Telephone Encounter (Signed)
Called patient to discuss results of ultrasound.  Reviewed her ultrasound did not show any fibroids, endometrial thickness was 7 mm, which is nonspecific for premenopausal women, and no ovarian abnormalities.  We discussed that the cervical abnormality noted on my physical exam was not noted on ultrasound which can be common.  She notes that she went to the ED due to having increased bleeding where she was having to change a tampon every 2-3 hours, but left before being seen because she had waited for 8 hours.  She notes since then her bleeding has completely resolved, she has maybe a scant amount of blood every now and then but is not using tampons anymore due to no bleeding, bleeding stopped two days ago.  She is taking her iron every other day denies constipation or other side effects.  She denies any dizziness, lightheadedness, chest pain, or dyspnea.  We discussed that these would be return precautions, as well as if her bleeding started again or was briskly with clots, these would all be reasons to give [place] a call or go to ED.  Otherwise, we will continue her iron, we have sent her OB/GYN referral and she is waiting to get a call from them, and we will see her back at her scheduled follow-up appointment on 01/03/2021 with Dr. Maudie Mercury.  Answered all patient questions and concerns  Yehuda Savannah MD

## 2021-01-02 DIAGNOSIS — E0590 Thyrotoxicosis, unspecified without thyrotoxic crisis or storm: Secondary | ICD-10-CM | POA: Insufficient documentation

## 2021-01-02 NOTE — Progress Notes (Signed)
   SUBJECTIVE:  CHIEF COMPLAINT / HPI:   1. Subclinical hyperthyroidism Patient denies weight loss, tachycardia, arrhythmia, palpitations, irritablity, tremor, sweating, enlarged thyroid, thinning hair.   2. Iron deficiency anemia secondary to inadequate dietary iron intake & chronic blood loss  Anemia, chronic  Patient following up for anemia. Bleeding stopped two weeks ago. She denies ay tachycardia, shortness of breath, dizziness, lightheadedness. Reports that two years ago, was on medication for heavy menstrual cycles, but didn't work. S/p BTL. Taking her iron   PERTINENT  PMH / PSH: IDA, menorrhagia, cervical polyp  OBJECTIVE:  BP 108/66   Pulse 90   Ht 5\' 1"  (1.549 m)   Wt 149 lb 12.8 oz (67.9 kg)   LMP 11/23/2020 Comment: been bleeding off and on since but stopped recently  SpO2 100%   BMI 28.30 kg/m   General: well appearing female, NAD  HEENT: terminal hairs on chin   ASSESSMENT/PLAN:  IDA (iron deficiency anemia) Hemoglobin ranging 8-10 over the last decade, Normal MCV, Elevated RDW, Elevated platelets, Ferritin 03/03/20: 10  Likely due to menstrual bleeding. Patient reports female pattern hair growth which she waxes frequently. Can consider PCOS as cause of menorrhagia and can try hormonal contraception in the future if bleeding becomes an issue again.  - recheck CBC today   Subclinical hyperthyroidism Denies symptoms. Will recheck TSH and T4 today   Cervical polyp Has not heard from [facility] for appt. Provided number and address for patient to call and set up appt.     Wilber Oliphant, MD [place]

## 2021-01-03 ENCOUNTER — Ambulatory Visit (INDEPENDENT_AMBULATORY_CARE_PROVIDER_SITE_OTHER): Payer: Medicaid Other | Admitting: Family Medicine

## 2021-01-03 ENCOUNTER — Other Ambulatory Visit: Payer: Self-pay

## 2021-01-03 ENCOUNTER — Encounter: Payer: Self-pay | Admitting: Family Medicine

## 2021-01-03 VITALS — BP 108/66 | HR 90 | Ht 61.0 in | Wt 149.8 lb

## 2021-01-03 DIAGNOSIS — D508 Other iron deficiency anemias: Secondary | ICD-10-CM

## 2021-01-03 DIAGNOSIS — N841 Polyp of cervix uteri: Secondary | ICD-10-CM | POA: Diagnosis not present

## 2021-01-03 DIAGNOSIS — E0590 Thyrotoxicosis, unspecified without thyrotoxic crisis or storm: Secondary | ICD-10-CM

## 2021-01-03 NOTE — Assessment & Plan Note (Signed)
Denies symptoms. Will recheck TSH and T4 today

## 2021-01-03 NOTE — Assessment & Plan Note (Signed)
Hemoglobin ranging 8-10 over the last decade, Normal MCV, Elevated RDW, Elevated platelets, Ferritin 03/03/20: 10  Likely due to menstrual bleeding. Patient reports female pattern hair growth which she waxes frequently. Can consider PCOS as cause of menorrhagia and can try hormonal contraception in the future if bleeding becomes an issue again.  - recheck CBC today

## 2021-01-03 NOTE — Patient Instructions (Addendum)
Call [facility] for appointment for cervical polyp  Address: [address] [place], [place] [address] Phone: [phone]  Bleeding  - improved  - checking labs today  - if stable, we will continue to monitor the vaginal bleeding.  - I would recommend trying birth control again in the future if you have episodes of vaginal bleeding more often   Subclinical Hyperthyroidism  - checking labs today  - we will continue to follow this periodically  - call if you develop any symptoms (palpatations, sweating, nervousness, etc).

## 2021-01-03 NOTE — Assessment & Plan Note (Signed)
Has not heard from [facility] for appt. Provided number and address for patient to call and set up appt.

## 2021-01-04 LAB — CBC
Hematocrit: 29.1 % — ABNORMAL LOW (ref 34.0–46.6)
Hemoglobin: 8.9 g/dL — ABNORMAL LOW (ref 11.1–15.9)
MCH: 24.3 pg — ABNORMAL LOW (ref 26.6–33.0)
MCHC: 30.6 g/dL — ABNORMAL LOW (ref 31.5–35.7)
MCV: 80 fL (ref 79–97)
Platelets: 439 10*3/uL (ref 150–450)
RBC: 3.66 x10E6/uL — ABNORMAL LOW (ref 3.77–5.28)
RDW: 16.2 % — ABNORMAL HIGH (ref 11.7–15.4)
WBC: 6.3 10*3/uL (ref 3.4–10.8)

## 2021-01-04 LAB — TSH+FREE T4
Free T4: 1.21 ng/dL (ref 0.82–1.77)
TSH: 0.718 u[IU]/mL (ref 0.450–4.500)

## 2021-01-22 ENCOUNTER — Other Ambulatory Visit: Payer: Self-pay | Admitting: Family Medicine

## 2021-01-22 DIAGNOSIS — Z1231 Encounter for screening mammogram for malignant neoplasm of breast: Secondary | ICD-10-CM

## 2021-02-08 ENCOUNTER — Ambulatory Visit (INDEPENDENT_AMBULATORY_CARE_PROVIDER_SITE_OTHER): Payer: Medicaid Other | Admitting: Obstetrics and Gynecology

## 2021-02-08 ENCOUNTER — Other Ambulatory Visit: Payer: Self-pay

## 2021-02-08 ENCOUNTER — Encounter: Payer: Self-pay | Admitting: Obstetrics and Gynecology

## 2021-02-08 VITALS — BP 121/62 | HR 92 | Wt 149.5 lb

## 2021-02-08 DIAGNOSIS — N841 Polyp of cervix uteri: Secondary | ICD-10-CM | POA: Diagnosis not present

## 2021-02-08 DIAGNOSIS — N923 Ovulation bleeding: Secondary | ICD-10-CM

## 2021-02-08 DIAGNOSIS — D500 Iron deficiency anemia secondary to blood loss (chronic): Secondary | ICD-10-CM | POA: Diagnosis not present

## 2021-02-08 DIAGNOSIS — N920 Excessive and frequent menstruation with regular cycle: Secondary | ICD-10-CM | POA: Diagnosis not present

## 2021-02-08 MED ORDER — IBUPROFEN 600 MG PO TABS: 600.0000 mg | ORAL_TABLET | Freq: Four times a day (QID) | ORAL | 2 refills | Status: DC | PRN

## 2021-02-08 NOTE — Patient Instructions (Signed)
Levonorgestrel intrauterine device (IUD) What is this medicine? LEVONORGESTREL IUD (LEE voe nor jes trel) is a contraceptive (birth control) device. The device is placed inside the uterus by a health care provider. It is used to prevent pregnancy. Some devices can also be used to treat heavy bleeding that occurs during your period. This medicine may be used for other purposes; ask your health care provider or pharmacist if you have questions. COMMON BRAND NAME(S): Kyleena, LILETTA, Mirena, Skyla What should I tell my health care provider before I take this medicine? They need to know if you have any of these conditions:  abnormal Pap smear  cancer of the breast, uterus, or cervix  diabetes  endometritis  genital or pelvic infection now or in the past  have more than one sexual partner or your partner has more than one partner  heart disease  history of an ectopic or tubal pregnancy  immune system problems  IUD in place  liver disease or tumor  problems with blood clots or take blood-thinners  seizures  use intravenous drugs  uterus of unusual shape  vaginal bleeding that has not been explained  an unusual or allergic reaction to levonorgestrel, other hormones, silicone, or polyethylene, medicines, foods, dyes, or preservatives  pregnant or trying to get pregnant  breast-feeding How should I use this medicine? This device is placed inside the uterus by a health care professional. Talk to your pediatrician regarding the use of this medicine in children. Special care may be needed. Overdosage: If you think you have taken too much of this medicine contact a poison control center or emergency room at once. NOTE: This medicine is only for you. Do not share this medicine with others. What if I miss a dose? This does not apply. Depending on the brand of device you have inserted, the device will need to be replaced every 3 to 7 years if you wish to continue using this type  of birth control. What may interact with this medicine? Do not take this medicine with any of the following medications:  amprenavir  bosentan  fosamprenavir This medicine may also interact with the following medications:  aprepitant  armodafinil  barbiturate medicines for inducing sleep or treating seizures  bexarotene  boceprevir  griseofulvin  medicines to treat seizures like carbamazepine, ethotoin, felbamate, oxcarbazepine, phenytoin, topiramate  modafinil  pioglitazone  rifabutin  rifampin  rifapentine  some medicines to treat HIV infection like atazanavir, efavirenz, indinavir, lopinavir, nelfinavir, tipranavir, ritonavir  St. John's wort  warfarin This list may not describe all possible interactions. Give your health care provider a list of all the medicines, herbs, non-prescription drugs, or dietary supplements you use. Also tell them if you smoke, drink alcohol, or use illegal drugs. Some items may interact with your medicine. What should I watch for while using this medicine? Visit your doctor or health care professional for regular check ups. See your doctor if you or your partner has sexual contact with others, becomes HIV positive, or gets a sexual transmitted disease. This product does not protect you against HIV infection (AIDS) or other sexually transmitted diseases. You can check the placement of the IUD yourself by reaching up to the top of your vagina with clean fingers to feel the threads. Do not pull on the threads. It is a good habit to check placement after each menstrual period. Call your doctor right away if you feel more of the IUD than just the threads or if you cannot feel the threads   at all. The IUD may come out by itself. You may become pregnant if the device comes out. If you notice that the IUD has come out use a backup birth control method like condoms and call your health care provider. Using tampons will not change the position of the  IUD and are okay to use during your period. This IUD can be safely scanned with magnetic resonance imaging (MRI) only under specific conditions. Before you have an MRI, tell your healthcare provider that you have an IUD in place, and which type of IUD you have in place. What side effects may I notice from receiving this medicine? Side effects that you should report to your doctor or health care professional as soon as possible:  allergic reactions like skin rash, itching or hives, swelling of the face, lips, or tongue  fever, flu-like symptoms  genital sores  high blood pressure  no menstrual period for 6 weeks during use  pain, swelling, warmth in the leg  pelvic pain or tenderness  severe or sudden headache  signs of pregnancy  stomach cramping  sudden shortness of breath  trouble with balance, talking, or walking  unusual vaginal bleeding, discharge  yellowing of the eyes or skin Side effects that usually do not require medical attention (report to your doctor or health care professional if they continue or are bothersome):  acne  breast pain  change in sex drive or performance  changes in weight  cramping, dizziness, or faintness while the device is being inserted  headache  irregular menstrual bleeding within first 3 to 6 months of use  nausea This list may not describe all possible side effects. Call your doctor for medical advice about side effects. You may report side effects to FDA at 1-800-FDA-1088. Where should I keep my medicine? This does not apply. NOTE: This sheet is a summary. It may not cover all possible information. If you have questions about this medicine, talk to your doctor, pharmacist, or health care provider.  2021 Elsevier/Gold Standard (2020-05-16 16:27:45)  

## 2021-02-08 NOTE — Progress Notes (Signed)
   History:  Ms. Kaitlyn West is a 40 y.o. No obstetric history on file. who presents to clinic today for irregular bleeding and cervical polyp  Bleeding April 24 -may 2nd.  Notices a popping and discomfort sensation with intercourse. Has a known ?polyp on cervix.  -reports uses lubricant w intercourse -did not have this issue w prior partners   Periods are very heavy and she describes them as 'horrible.'  -bad cramping -large clots  -7 days  -has to wear a pad/tampon together and has to change tampon every hour. Wears ultra pad. At night has to get up to change pads. Tried out menstrual cup and she filled it in less than two hours.  -reports regular periods up until the last year - now having more irregularity  -also now has vaginal dryness  -has tried OCPs and did not help  -takes PO iron  -has bad pica   The following portions of the patient's history were reviewed and updated as appropriate: allergies, current medications, family history, past medical history, social history, past surgical history and problem list.  Review of Systems:  Review of Systems  Constitutional: Negative for chills and fever.  Respiratory: Negative for shortness of breath.   Gastrointestinal: Negative for abdominal pain, blood in stool and constipation.  Genitourinary: Negative for dysuria.  Neurological: Negative for dizziness and headaches.      Objective:  Physical Exam Wt 149 lb 8 oz (67.8 kg)   BMI 28.25 kg/m  Physical Exam Vitals and nursing note reviewed. Exam conducted with a chaperone present.  Constitutional:      Appearance: Normal appearance.  Cardiovascular:     Rate and Rhythm: Normal rate and regular rhythm.  Pulmonary:     Effort: Pulmonary effort is normal.  Abdominal:     Palpations: Abdomen is soft.  Genitourinary:    General: Normal vulva.     Vagina: Normal.     Cervix: No cervical motion tenderness, discharge, friability or cervical bleeding.  Skin:    General:  Skin is warm and dry.  Neurological:     Mental Status: She is alert.  Psychiatric:        Mood and Affect: Mood normal.        Behavior: Behavior normal.       Labs and Imaging Pelvis US 12/18/2020:  FINDINGS: Uterus Measurements: 11.3 x 4.8 x 7.2 cm = volume: 205 mL. No fibroids or other mass visualized. Endometrium Thickness: 7 mm.  No focal abnormality visualized. Right ovary Measurements: 3.0 x 1.7 x 1.6 cm = volume: 4.4 mL. Normal appearance/no adnexal mass. Left ovary Measurements: 3.7 x 2.6 x 2.9 cm = volume: 14.6 mL. Normal appearance/no adnexal mass. Other findings No abnormal free fluid. IMPRESSION: Negative pelvic ultrasound.  Assessment & Plan:  1. Intermenstrual bleeding -resolved   2. Cervical polyp - not visualized on exam, likely resolved. Discussed that these can self resolve. Suspect 'popping' sensation is unrelated and discussed other strategies to trial w intercourse.   3. Iron deficiency anemia due to chronic blood loss -will schedule for Venofer infusion -given heavy cycles discuss treatment options. Patient is interested in [place] IUD. Discussed risks/benefits of IUD and method of insertion and patient elects to proceed.   4. Menorrhagia with regular cycle -will schedule for liletta IUD placement   Janet Berlin, MD 02/08/2021 8:39 AM    Sharene Skeans, MD [facility] Family Medicine Fellow, [facility] for [facility], [place]

## 2021-02-12 ENCOUNTER — Encounter: Payer: Self-pay | Admitting: Physician Assistant

## 2021-02-12 ENCOUNTER — Other Ambulatory Visit: Payer: Self-pay | Admitting: Obstetrics and Gynecology

## 2021-02-12 ENCOUNTER — Non-Acute Institutional Stay (HOSPITAL_COMMUNITY)
Admission: RE | Admit: 2021-02-12 | Discharge: 2021-02-12 | Disposition: A | Discharge status: Home / Self Care | Payer: Medicaid Other | Source: Ambulatory Visit | Attending: Internal Medicine | Admitting: Internal Medicine

## 2021-02-12 ENCOUNTER — Other Ambulatory Visit: Payer: Self-pay

## 2021-02-12 DIAGNOSIS — D500 Iron deficiency anemia secondary to blood loss (chronic): Secondary | ICD-10-CM | POA: Diagnosis not present

## 2021-02-12 MED ORDER — ALBUTEROL SULFATE (2.5 MG/3ML) 0.083% IN NEBU: 2.5000 mg | INHALATION_SOLUTION | Freq: Once | RESPIRATORY_TRACT | Status: DC | PRN

## 2021-02-12 MED ORDER — SODIUM CHLORIDE 0.9 % IV SOLN
510.0000 mg | Freq: Once | INTRAVENOUS | Status: AC
  Administered 2021-02-12: 510 mg via INTRAVENOUS
  Filled 2021-02-12: qty 510

## 2021-02-12 MED ORDER — SODIUM CHLORIDE 0.9 % IV SOLN
INTRAVENOUS | Status: DC | PRN
  Administered 2021-02-12: 250 mL via INTRAVENOUS

## 2021-02-12 MED ORDER — DIPHENHYDRAMINE HCL 50 MG/ML IJ SOLN: 25.0000 mg | Freq: Once | INTRAMUSCULAR | Status: DC | PRN

## 2021-02-12 MED ORDER — EPINEPHRINE PF 1 MG/ML IJ SOLN
0.3000 mg | Freq: Once | INTRAMUSCULAR | Status: DC | PRN
  Filled 2021-02-12: qty 1

## 2021-02-12 MED ORDER — SODIUM CHLORIDE 0.9 % IV BOLUS: 500.0000 mL | Freq: Once | INTRAVENOUS | Status: DC | PRN

## 2021-02-12 MED ORDER — METHYLPREDNISOLONE SODIUM SUCC 125 MG IJ SOLR: 125.0000 mg | Freq: Once | INTRAMUSCULAR | Status: DC | PRN

## 2021-02-12 NOTE — Progress Notes (Signed)
Patient received via IV Feraheme as ordered by Arlina Robes, MD. Observed for at least 30 minutes post infusion.Tolerated well, vitals stable, discharge instructions given, verbalized understanding. Patient alert, oriented and ambulatory at the time of discharge.

## 2021-02-12 NOTE — Discharge Instructions (Signed)
Ferumoxytol injection What is this medicine? FERUMOXYTOL is an iron complex. Iron is used to make healthy red blood cells, which carry oxygen and nutrients throughout the body. This medicine is used to treat iron deficiency anemia. This medicine may be used for other purposes; ask your health care provider or pharmacist if you have questions. COMMON BRAND NAME(S): Feraheme What should I tell my health care provider before I take this medicine? They need to know if you have any of these conditions:  anemia not caused by low iron levels  high levels of iron in the blood  magnetic resonance imaging (MRI) test scheduled  an unusual or allergic reaction to iron, other medicines, foods, dyes, or preservatives  pregnant or trying to get pregnant  breast-feeding How should I use this medicine? This medicine is for injection into a vein. It is given by a health care professional in a hospital or clinic setting. Talk to your pediatrician regarding the use of this medicine in children. Special care may be needed. Overdosage: If you think you have taken too much of this medicine contact a poison control center or emergency room at once. NOTE: This medicine is only for you. Do not share this medicine with others. What if I miss a dose? It is important not to miss your dose. Call your doctor or health care professional if you are unable to keep an appointment. What may interact with this medicine? This medicine may interact with the following medications:  other iron products This list may not describe all possible interactions. Give your health care provider a list of all the medicines, herbs, non-prescription drugs, or dietary supplements you use. Also tell them if you smoke, drink alcohol, or use illegal drugs. Some items may interact with your medicine. What should I watch for while using this medicine? Visit your doctor or healthcare professional regularly. Tell your doctor or healthcare  professional if your symptoms do not start to get better or if they get worse. You may need blood work done while you are taking this medicine. You may need to follow a special diet. Talk to your doctor. Foods that contain iron include: whole grains/cereals, dried fruits, beans, or peas, leafy green vegetables, and organ meats (liver, kidney). What side effects may I notice from receiving this medicine? Side effects that you should report to your doctor or health care professional as soon as possible:  allergic reactions like skin rash, itching or hives, swelling of the face, lips, or tongue  breathing problems  changes in blood pressure  feeling faint or lightheaded, falls  fever or chills  flushing, sweating, or hot feelings  swelling of the ankles or feet Side effects that usually do not require medical attention (report to your doctor or health care professional if they continue or are bothersome):  diarrhea  headache  nausea, vomiting  stomach pain This list may not describe all possible side effects. Call your doctor for medical advice about side effects. You may report side effects to FDA at 1-800-FDA-1088. Where should I keep my medicine? This drug is given in a hospital or clinic and will not be stored at home. NOTE: This sheet is a summary. It may not cover all possible information. If you have questions about this medicine, talk to your doctor, pharmacist, or health care provider.  2021 Elsevier/Gold Standard (2016-11-04 20:21:10)  

## 2021-02-28 ENCOUNTER — Other Ambulatory Visit: Payer: Self-pay

## 2021-02-28 ENCOUNTER — Ambulatory Visit (INDEPENDENT_AMBULATORY_CARE_PROVIDER_SITE_OTHER): Payer: Medicaid Other | Admitting: Obstetrics and Gynecology

## 2021-02-28 ENCOUNTER — Encounter: Payer: Self-pay | Admitting: Obstetrics and Gynecology

## 2021-02-28 VITALS — BP 105/56 | HR 80 | Ht 61.5 in | Wt 144.4 lb

## 2021-02-28 DIAGNOSIS — N920 Excessive and frequent menstruation with regular cycle: Secondary | ICD-10-CM

## 2021-02-28 DIAGNOSIS — Z30430 Encounter for insertion of intrauterine contraceptive device: Secondary | ICD-10-CM | POA: Insufficient documentation

## 2021-02-28 DIAGNOSIS — Z30432 Encounter for removal of intrauterine contraceptive device: Secondary | ICD-10-CM | POA: Insufficient documentation

## 2021-02-28 MED ORDER — LEVONORGESTREL 20.1 MCG/DAY IU IUD
1.0000 | INTRAUTERINE_SYSTEM | Freq: Once | INTRAUTERINE | Status: AC
  Administered 2021-02-28: 1 via INTRAUTERINE

## 2021-02-28 NOTE — Progress Notes (Signed)
Patient ID: Kaitlyn West, female   DOB: March 03, 1981, 40 y.o.   MRN: [id number]    [place] PROCEDURE NOTE  [name] is a 40 y.o. No obstetric history on file. here for [place] IUD insertion. No GYN concerns.    IUD Insertion Procedure Note Patient identified, informed consent performed, consent signed.   Discussed risks of irregular bleeding, cramping, infection, malpositioning or misplacement of the IUD outside the uterus which may require further procedure such as laparoscopy. Time out was performed.  Urine pregnancy test negative.  Speculum placed in the vagina.  Cervix visualized.  Cleaned with Betadine x 2.  Grasped anteriorly with a single tooth tenaculum.  Uterus sounded to 8 cm.  [place] IUD placed per manufacturer's recommendations.  Strings trimmed to 3 cm. Tenaculum was removed, good hemostasis noted.  Patient tolerated procedure well.   Patient was given post-procedure instructions.  She was advised to have backup contraception for one week.  Patient was also asked to check IUD strings periodically and follow up in 4 weeks for IUD check.    Arlina Robes, MD, [place] Attending [place] for [place]

## 2021-02-28 NOTE — Patient Instructions (Signed)

## 2021-03-07 ENCOUNTER — Encounter: Payer: Self-pay | Admitting: Physician Assistant

## 2021-03-07 ENCOUNTER — Ambulatory Visit: Payer: Medicaid Other | Admitting: Physician Assistant

## 2021-03-07 ENCOUNTER — Other Ambulatory Visit (INDEPENDENT_AMBULATORY_CARE_PROVIDER_SITE_OTHER): Payer: Medicaid Other

## 2021-03-07 VITALS — BP 94/60 | Ht 63.0 in | Wt 149.0 lb

## 2021-03-07 DIAGNOSIS — D500 Iron deficiency anemia secondary to blood loss (chronic): Secondary | ICD-10-CM

## 2021-03-07 DIAGNOSIS — K219 Gastro-esophageal reflux disease without esophagitis: Secondary | ICD-10-CM

## 2021-03-07 DIAGNOSIS — K5900 Constipation, unspecified: Secondary | ICD-10-CM

## 2021-03-07 LAB — CBC WITH DIFFERENTIAL/PLATELET
Basophils Absolute: 0.1 10*3/uL (ref 0.0–0.1)
Basophils Relative: 0.9 % (ref 0.0–3.0)
Eosinophils Absolute: 0.1 10*3/uL (ref 0.0–0.7)
Eosinophils Relative: 1.7 % (ref 0.0–5.0)
HCT: 34.7 % — ABNORMAL LOW (ref 36.0–46.0)
Hemoglobin: 11.1 g/dL — ABNORMAL LOW (ref 12.0–15.0)
Lymphocytes Relative: 19 % (ref 12.0–46.0)
Lymphs Abs: 1.3 10*3/uL (ref 0.7–4.0)
MCHC: 32 g/dL (ref 30.0–36.0)
MCV: 82.4 fl (ref 78.0–100.0)
Monocytes Absolute: 0.4 10*3/uL (ref 0.1–1.0)
Monocytes Relative: 6 % (ref 3.0–12.0)
Neutro Abs: 5 10*3/uL (ref 1.4–7.7)
Neutrophils Relative %: 72.4 % (ref 43.0–77.0)
Platelets: 287 10*3/uL (ref 150.0–400.0)
RBC: 4.21 Mil/uL (ref 3.87–5.11)
RDW: 25.4 % — ABNORMAL HIGH (ref 11.5–15.5)
WBC: 7 10*3/uL (ref 4.0–10.5)

## 2021-03-07 LAB — IBC + FERRITIN
Ferritin: 27.5 ng/mL (ref 10.0–291.0)
Iron: 96 ug/dL (ref 42–145)
Saturation Ratios: 27.4 % (ref 20.0–50.0)
Transferrin: 250 mg/dL (ref 212.0–360.0)

## 2021-03-07 MED ORDER — FAMOTIDINE 40 MG PO TABS: 40.0000 mg | ORAL_TABLET | Freq: Every morning | ORAL | 6 refills | Status: DC

## 2021-03-07 MED ORDER — PLENVU 140 G PO SOLR: 1.0000 | ORAL | 0 refills | Status: DC

## 2021-03-07 NOTE — Patient Instructions (Addendum)
If you are age 42 or younger, your body mass index should be between 19-25. Your Body mass index is 26.39 kg/m. If this is out of the aformentioned range listed, please consider follow up with your Primary Care Provider.  __________________________________________________________  The [facility] providers would like to encourage you to use [facility] to communicate with providers for non-urgent requests or questions.  Due to long hold times on the telephone, sending your provider a message by [facility] may be a faster and more efficient way to get a response.  Please allow 48 business hours for a response.  Please remember that this is for non-urgent requests.   You have been scheduled for an endoscopy and colonoscopy. Please follow the written instructions given to you at your visit today. Please pick up your prep supplies at the pharmacy within the next 1-3 days. If you use inhalers (even only as needed), please bring them with you on the day of your procedure.  Your provider has requested that you go to the basement level for lab work before leaving today. Press "B" on the elevator. The lab is located at the first door on the left as you exit the elevator.   START Famotidine 40 mg 1 tablet every morning.  START Miralax 1 capful in water or juice daily for constipation.  Follow up pending.  Thank you for entrusting me with your care and choosing [facility].  Amy Esterwood, PA-C

## 2021-03-07 NOTE — Progress Notes (Signed)
Subjective:    Patient ID: Kaitlyn West, female    DOB: 10-04-1980, 40 y.o.   MRN: [id number]  HPI Kaitlyn West is a pleasant 40 year old African-American female, new to GI today referred by: Family practice/Dr. Zettie Cooley for evaluation of iron deficiency anemia.  No prior GI evaluation. Patient is generally in good health, status post bilateral tubal ligation, has history of subclinical hyperthyroidism.  She has long history of menorrhagia over at least the past 10 years, and previously iron deficiency anemia has been attributed to menorrhagia.  She did have an increase in vaginal bleeding earlier this spring over her baseline heavy menses, and actually has just had an IUD placed within the past week to help control the menorrhagia. She says she has been off and on iron supplements over the years but generally finds them constipating. She did have a recent iron infusion x1. Last visible labs on 12/15/2020 hemoglobin 8.8 hematocrit 29.9 MCV of 83, then repeated 01/03/2021 hemoglobin 8.9 hematocrit 29.1  Ferritin was done a year ago June 2021 and low at 10  Patient does not have any immediate family history of any intestinal issues does have a cousin with colitis.  Her father had polyps, there is no history of colon cancer. Patient says she has chronic issues with constipation and may go several days between bowel movements.  This has been her pattern for many years.  She has not noted any melena or hematochezia. She also has been having heartburn and indigestion over the past several months worse postprandially.  This is occurring very frequently, no associated dysphagia or odynophagia. He is not taking any regular aspirin or NSAIDs.  Review of Systems Pertinent positive and negative review of systems were noted in the above HPI section.  All other review of systems was otherwise negative.  Outpatient Encounter Medications as of 03/07/2021  Medication Sig  . cetirizine (ZYRTEC) 10 MG tablet Take 10  mg by mouth daily.  . famotidine (PEPCID) 40 MG tablet Take 1 tablet (40 mg total) by mouth in the morning.  . ferrous sulfate 325 (65 FE) MG EC tablet Take 1 tablet (325 mg total) by mouth every other day.  . ibuprofen (ADVIL) 600 MG tablet Take 1 tablet (600 mg total) by mouth every 6 (six) hours as needed for mild pain, moderate pain or cramping.  Marland Kitchen PEG-KCl-NaCl-NaSulf-Na Asc-C (PLENVU) 140 g SOLR Take 1 kit by mouth as directed.   No facility-administered encounter medications on file as of 03/07/2021.   Allergies  Allergen Reactions  . Pistachio Nut (Diagnostic)   . Shellfish Allergy Itching   Patient Active Problem List   Diagnosis Date Noted  . Encounter for insertion of intrauterine contraceptive device (IUD) 02/28/2021  . Subclinical hyperthyroidism 01/02/2021  . Intermenstrual bleeding 12/08/2020  . Decreased thyroid stimulating hormone (TSH) level 06/07/2019  . Family hx of ovarian malignancy 02/26/2019  . Family history of G6PD 02/26/2019  . IDA (iron deficiency anemia) 10/07/2008  . SMOKER 10/07/2008  . Menorrhagia with regular cycle 10/07/2008   Social History   Socioeconomic History  . Marital status: Single    Spouse name: Not on file  . Number of children: 2  . Years of education: Not on file  . Highest education level: Not on file  Occupational History  . Occupation: Unemployed     Fish farm manager: [employer]  . Occupation: [employer]  Tobacco Use  . Smoking status: Current Every Day Smoker    Packs/day: 0.50  Types: Cigarettes  . Smokeless tobacco: Never Used  Substance and Sexual Activity  . Alcohol use: No  . Drug use: No  . Sexual activity: Yes    Partners: Male    Birth control/protection: Surgical  Other Topics Concern  . Not on file  Social History Narrative   Married since 2008.   2 daughters.    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    Social Determinants of Health   Financial Resource Strain: Not on file  Food Insecurity: No Food Insecurity   . Worried About Charity fundraiser in the Last Year: Never true  . Ran Out of Food in the Last Year: Never true  Transportation Needs: No Transportation Needs  . Lack of Transportation (Medical): No  . Lack of Transportation (Non-Medical): No  Physical Activity: Not on file  Stress: Not on file  Social Connections: Not on file  Intimate Partner Violence: Not on file    Ms. Kaitlyn West's family history includes Breast cancer in her maternal grandmother; Diabetes Mellitus II in her father and mother; Migraines in her brother, daughter, and mother.      Objective:    Vitals:   03/07/21 1032  BP: 94/60    Physical Exam Well-developed well-nourished  AA female  in no acute distress.  Pleasant Weight, 149 BMI 26.3  HEENT; nontraumatic normocephalic, EOMI, PE R LA, sclera anicteric. Oropharynx; not examined today Neck; supple, no JVD Cardiovascular; regular rate and rhythm with S1-S2, no murmur rub or gallop Pulmonary; Clear bilaterally Abdomen; soft, there is very mild tenderness deep in the right lower quadrant nondistended, no palpable mass or hepatosplenomegaly, bowel sounds are active Rectal; not done today Skin; benign exam, no jaundice rash or appreciable lesions Extremities; no clubbing cyanosis or edema skin warm and dry Neuro/Psych; alert and oriented x4, grossly nonfocal mood and affect appropriate       Assessment & Plan:   #29 40 year old African-American female with history of chronic iron deficiency anemia, generally attributed to menorrhagia in the past.  Last iron studies checked 1 year ago, most recent hemoglobin 8.9 Patient had increase in vaginal bleeding over the past few months and has just had an IUD placed to see if this will help with symptoms.  Iron deficiency is likely secondary to menorrhagia however should rule out sources of potential superimposed chronic GI blood loss  No complaints of melena or hematochezia.  She has had frequent heartburn and  indigestion over the past few months, symptoms consistent with GERD, no dysphagia. Rule out chronic gastropathy  #2 chronic constipation-likely functional, rule out occult lesion  #3 status post bilateral tubal ligation  Plan; Repeat CBC today, ferritin, serum iron/TIBC/iron sat Continue ferrous sulfate 325 mg p.o. daily Increase water intake to 60 ounces per day, start trial of MiraLAX 17 g in 8 ounces of water daily or every other day.  We also discussed a trial of Benefiber daily. Start famotidine 40 mg p.o. every morning. She was given Scientist, clinical (histocompatibility and immunogenetics) on antireflux regimen and diet. Patient will be scheduled for EGD and colonoscopy with Dr. Bryan Lemma.  Both procedures were discussed in detail with the patient including indications risks and benefits and she is agreeable to proceed.  Further recommendations pending findings at endoscopic evaluation.    Salil Raineri Genia Harold PA-C 03/07/2021   Cc: Wilber Oliphant, MD

## 2021-03-12 NOTE — Progress Notes (Signed)
Agree with the assessment and plan as outlined by Amy Esterwood, PA-C.  Falan Hensler, DO, FACG  

## 2021-03-16 ENCOUNTER — Other Ambulatory Visit: Payer: Self-pay

## 2021-03-16 ENCOUNTER — Ambulatory Visit
Admission: RE | Admit: 2021-03-16 | Discharge: 2021-03-16 | Disposition: A | Discharge status: Home / Self Care | Payer: Medicaid Other | Source: Ambulatory Visit | Attending: Family Medicine | Admitting: Family Medicine

## 2021-03-16 DIAGNOSIS — Z1231 Encounter for screening mammogram for malignant neoplasm of breast: Secondary | ICD-10-CM

## 2021-03-30 ENCOUNTER — Other Ambulatory Visit (HOSPITAL_COMMUNITY)
Admission: RE | Admit: 2021-03-30 | Discharge: 2021-03-30 | Disposition: A | Discharge status: Home / Self Care | Payer: Medicaid Other | Source: Ambulatory Visit | Attending: Obstetrics & Gynecology | Admitting: Obstetrics & Gynecology

## 2021-03-30 ENCOUNTER — Other Ambulatory Visit: Payer: Self-pay

## 2021-03-30 ENCOUNTER — Ambulatory Visit (INDEPENDENT_AMBULATORY_CARE_PROVIDER_SITE_OTHER): Payer: Medicaid Other | Admitting: Obstetrics & Gynecology

## 2021-03-30 ENCOUNTER — Encounter: Payer: Self-pay | Admitting: Obstetrics & Gynecology

## 2021-03-30 VITALS — BP 105/66 | HR 88 | Wt 147.0 lb

## 2021-03-30 DIAGNOSIS — N760 Acute vaginitis: Secondary | ICD-10-CM | POA: Diagnosis not present

## 2021-03-30 DIAGNOSIS — N9089 Other specified noninflammatory disorders of vulva and perineum: Secondary | ICD-10-CM

## 2021-03-30 DIAGNOSIS — B9689 Other specified bacterial agents as the cause of diseases classified elsewhere: Secondary | ICD-10-CM | POA: Diagnosis not present

## 2021-03-30 DIAGNOSIS — Z30431 Encounter for routine checking of intrauterine contraceptive device: Secondary | ICD-10-CM | POA: Diagnosis not present

## 2021-03-30 MED ORDER — FLUCONAZOLE 150 MG PO TABS: 150.0000 mg | ORAL_TABLET | Freq: Once | ORAL | 3 refills | Status: AC

## 2021-03-30 NOTE — Progress Notes (Signed)
    GYNECOLOGY OFFICE ENCOUNTER NOTE  History:  40 y.o. F here today for today for IUD string check; Liletta  IUD was placed  02/28/2021. Having some expected irregular spotting. No other complaints about the IUD, no concerning side effects. She does report some vulvar irritation for a few days, desires Diflucan as she thinks she has a yeast infection.  The following portions of the patient's history were reviewed and updated as appropriate: allergies, current medications, past family history, past medical history, past social history, past surgical history and problem list. Last pap smear on 02/25/2019 was normal, negative HRHPV.  Review of Systems:  Pertinent items are noted in HPI.   Objective:  Physical Exam Blood pressure 105/66, pulse 88, weight 147 lb (66.7 kg). CONSTITUTIONAL: Well-developed, well-nourished female in no acute distress.  NEUROLOGIC: Alert and oriented to person, place, and time. Normal reflexes, muscle tone coordination.  PSYCHIATRIC: Normal mood and affect. Normal behavior. Normal judgment and thought content. CARDIOVASCULAR: Normal heart rate noted RESPIRATORY: Effort and breath sounds normal, no problems with respiration noted ABDOMEN: Soft, no distention noted.   PELVIC: External genitalia without erythema or rash; normal appearing vaginal mucosa and cervix. Brown discharge noted, testing sample obtained.  IUD strings visualized, about 2 cm in length outside cervix. Done in the presence of a chaperone.   Assessment & Plan:  1. IUD check up Patient to keep IUD in place for up to seven years; can come in for removal/replacement earlier if heavy bleeding resumes (usually around 4-5 year mark) or for any other concerns.  2. Vulvar irritation Diflucan prescribed but will follow up results and manage accordingly. - Cervicovaginal ancillary only - fluconazole (DIFLUCAN) 150 MG tablet; Take 1 tablet (150 mg total) by mouth once for 1 dose. Can take additional dose three  days later if symptoms persist  Dispense: 1 tablet; Refill: [place], MD, [place], [facility] for [employer], [place]

## 2021-04-03 LAB — CERVICOVAGINAL ANCILLARY ONLY
Bacterial Vaginitis (gardnerella): POSITIVE — AB
Candida Glabrata: NEGATIVE
Candida Vaginitis: POSITIVE — AB
Chlamydia: NEGATIVE
Comment: NEGATIVE
Comment: NEGATIVE
Comment: NEGATIVE
Comment: NEGATIVE
Comment: NEGATIVE
Comment: NORMAL
Neisseria Gonorrhea: NEGATIVE
Trichomonas: NEGATIVE

## 2021-04-05 MED ORDER — METRONIDAZOLE 500 MG PO TABS: 500.0000 mg | ORAL_TABLET | Freq: Two times a day (BID) | ORAL | 0 refills | Status: AC

## 2021-04-05 NOTE — Addendum Note (Signed)
Addended by: Verita Schneiders A on: 04/05/2021 10:09 AM   Modules accepted: Orders

## 2021-04-13 ENCOUNTER — Encounter: Payer: Self-pay | Admitting: Gastroenterology

## 2021-04-13 ENCOUNTER — Other Ambulatory Visit: Payer: Self-pay

## 2021-04-13 ENCOUNTER — Ambulatory Visit (AMBULATORY_SURGERY_CENTER): Payer: Medicaid Other | Admitting: Gastroenterology

## 2021-04-13 VITALS — BP 116/78 | HR 79 | Temp 98.0°F | Resp 14 | Ht 63.0 in | Wt 149.0 lb

## 2021-04-13 DIAGNOSIS — Q398 Other congenital malformations of esophagus: Secondary | ICD-10-CM

## 2021-04-13 DIAGNOSIS — D122 Benign neoplasm of ascending colon: Secondary | ICD-10-CM

## 2021-04-13 DIAGNOSIS — K621 Rectal polyp: Secondary | ICD-10-CM

## 2021-04-13 DIAGNOSIS — D128 Benign neoplasm of rectum: Secondary | ICD-10-CM

## 2021-04-13 DIAGNOSIS — K5900 Constipation, unspecified: Secondary | ICD-10-CM

## 2021-04-13 DIAGNOSIS — D500 Iron deficiency anemia secondary to blood loss (chronic): Secondary | ICD-10-CM | POA: Diagnosis not present

## 2021-04-13 DIAGNOSIS — R12 Heartburn: Secondary | ICD-10-CM | POA: Diagnosis not present

## 2021-04-13 DIAGNOSIS — K2970 Gastritis, unspecified, without bleeding: Secondary | ICD-10-CM | POA: Diagnosis not present

## 2021-04-13 MED ORDER — SODIUM CHLORIDE 0.9 % IV SOLN: 500.0000 mL | Freq: Once | INTRAVENOUS | Status: DC

## 2021-04-13 NOTE — Progress Notes (Deleted)
Report given to PACU, vss 

## 2021-04-13 NOTE — Progress Notes (Signed)
VS taken by [place] 

## 2021-04-13 NOTE — Progress Notes (Signed)
Report given to PACU, vss 

## 2021-04-13 NOTE — Progress Notes (Signed)
1457 Robinul 0.1 mg IV given due large amount of secretions upon assessment.  MD made aware, vss

## 2021-04-13 NOTE — Op Note (Signed)
[facility] Patient Name: Kaitlyn West Procedure Date: 04/13/2021 2:43 PM MRN: [id number] Endoscopist: Gerrit Heck , MD Age: 40 Referring MD:  Date of Birth: 1981/05/25 Gender: Female Account #: [id number] Procedure:                Colonoscopy Indications:              Iron deficiency anemia Medicines:                Monitored Anesthesia Care Procedure:                Pre-Anesthesia Assessment:                           - Prior to the procedure, a History and Physical                            was performed, and patient medications and                            allergies were reviewed. The patient's tolerance of                            previous anesthesia was also reviewed. The risks                            and benefits of the procedure and the sedation                            options and risks were discussed with the patient.                            All questions were answered, and informed consent                            was obtained. Prior Anticoagulants: The patient has                            taken no previous anticoagulant or antiplatelet                            agents. ASA Grade Assessment: II - A patient with                            mild systemic disease. After reviewing the risks                            and benefits, the patient was deemed in                            satisfactory condition to undergo the procedure.                           After obtaining informed consent, the colonoscope  was passed under direct vision. Throughout the                            procedure, the patient's blood pressure, pulse, and                            oxygen saturations were monitored continuously. The                            [id number] was introduced through the anus and                            advanced to the the cecum, identified by                            appendiceal orifice and ileocecal valve. The                             colonoscopy was performed without difficulty. The                            patient tolerated the procedure well. The quality                            of the bowel preparation was good. The ileocecal                            valve, appendiceal orifice, and rectum were                            photographed. Scope In: 3:07:09 PM Scope Out: 3:26:34 PM Scope Withdrawal Time: 0 hours 16 minutes 50 seconds  Total Procedure Duration: 0 hours 19 minutes 25 seconds  Findings:                 The perianal and digital rectal examinations were                            normal.                           A 5 mm polyp was found in the proximal ascending                            colon. The polyp was sessile. The polyp was removed                            with a cold snare. Resection and retrieval were                            complete. Estimated blood loss was minimal.                           Three sessile polyps were found in the rectum. The  polyps were 1 to 2 mm in size. These polyps were                            removed with a cold snare. Resection and retrieval                            were complete. Estimated blood loss was minimal.                           The retroflexed view of the distal rectum and anal                            verge was normal and showed no anal or rectal                            abnormalities.                           The exam was otherwise normal throughout the                            remainder of the colon. No areas of mucosal                            erythema, edema, erosions, or ulceration noted on                            this study. No evidence of bleeding or stigmata of                            recent bleeding. Complications:            No immediate complications. Estimated Blood Loss:     Estimated blood loss was minimal. Impression:               - One 5 mm polyp in the proximal  ascending colon,                            removed with a cold snare. Resected and retrieved.                           - Three 1 to 2 mm polyps in the rectum, removed                            with a cold snare. Resected and retrieved.                           - The distal rectum and anal verge are normal on                            retroflexion view. Recommendation:           - Patient has a contact number available for  emergencies. The signs and symptoms of potential                            delayed complications were discussed with the                            patient. Return to normal activities tomorrow.                            Written discharge instructions were provided to the                            patient.                           - Resume previous diet.                           - Continue present medications.                           - Await pathology results.                           - Repeat colonoscopy for surveillance based on                            pathology results. Gerrit Heck, MD 04/13/2021 3:34:44 PM

## 2021-04-13 NOTE — Op Note (Signed)
[facility] Patient Name: Kaitlyn West Procedure Date: 04/13/2021 2:54 PM MRN: [id number] Endoscopist: Gerrit Heck , MD Age: 40 Referring MD:  Date of Birth: August 11, 1981 Gender: Female Account #: [id number] Procedure:                Upper GI endoscopy Indications:              Iron deficiency anemia, Heartburn, Suspected                            esophageal reflux, Dyspepsia Medicines:                Monitored Anesthesia Care Procedure:                Pre-Anesthesia Assessment:                           - Prior to the procedure, a History and Physical                            was performed, and patient medications and                            allergies were reviewed. The patient's tolerance of                            previous anesthesia was also reviewed. The risks                            and benefits of the procedure and the sedation                            options and risks were discussed with the patient.                            All questions were answered, and informed consent                            was obtained. Prior Anticoagulants: The patient has                            taken no previous anticoagulant or antiplatelet                            agents. ASA Grade Assessment: II - A patient with                            mild systemic disease. After reviewing the risks                            and benefits, the patient was deemed in                            satisfactory condition to undergo the procedure.  After obtaining informed consent, the endoscope was                            passed under direct vision. Throughout the                            procedure, the patient's blood pressure, pulse, and                            oxygen saturations were monitored continuously. The                            GIF [id number] #[id number] was introduced through the                            mouth, and advanced to the second  part of duodenum.                            The upper GI endoscopy was accomplished without                            difficulty. The patient tolerated the procedure                            well. 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                           The examined esophagus was otherwise normal.                           The Z-line was regular and was found 37 cm from the                            incisors.                           The gastroesophageal flap valve was visualized                            endoscopically and classified as Hill Grade III                            (minimal fold, loose to endoscope, hiatal hernia                            likely).                           The entire examined stomach was normal. Biopsies  were taken with a cold forceps for Helicobacter                            pylori testing. Estimated blood loss was minimal.                           The examined duodenum was normal. Biopsies for                            histology were taken with a cold forceps for                            evaluation of celiac disease. Estimated blood loss                            was minimal. Complications:            No immediate complications. Estimated Blood Loss:     Estimated blood loss was minimal. Impression:               - Ectopic gastric mucosa in the upper third of the                            esophagus.                           - Normal esophagus.                           - Z-line regular, 37 cm from the incisors.                           - Gastroesophageal flap valve classified as Hill                            Grade III (minimal fold, loose to endoscope, hiatal                            hernia likely).                           - Normal stomach. Biopsied.                           -  Normal examined duodenum. Biopsied. Recommendation:           - Patient has a contact number available for                            emergencies. The signs and symptoms of potential                            delayed complications were discussed with the                            patient. Return to normal activities tomorrow.  Written discharge instructions were provided to the                            patient.                           - Resume previous diet.                           - Continue present medications.                           - Await pathology results.                           - Perform a colonoscopy today. Gerrit Heck, MD 04/13/2021 3:31:34 PM

## 2021-04-13 NOTE — Patient Instructions (Signed)
YOU HAD AN ENDOSCOPIC PROCEDURE TODAY AT [facility]:   Refer to the procedure report that was given to you for any specific questions about what was found during the examination.  If the procedure report does not answer your questions, please call your gastroenterologist to clarify.  If you requested that your care partner not be given the details of your procedure findings, then the procedure report has been included in a sealed envelope for you to review at your convenience later.  YOU SHOULD EXPECT: Some feelings of bloating in the abdomen. Passage of more gas than usual.  Walking can help get rid of the air that was put into your GI tract during the procedure and reduce the bloating. If you had a lower endoscopy (such as a colonoscopy or flexible sigmoidoscopy) you may notice spotting of blood in your stool or on the toilet paper. If you underwent a bowel prep for your procedure, you may not have a normal bowel movement for a few days.  Please Note:  You might notice some irritation and congestion in your nose or some drainage.  This is from the oxygen used during your procedure.  There is no need for concern and it should clear up in a day or so.  SYMPTOMS TO REPORT IMMEDIATELY:  Following lower endoscopy (colonoscopy or flexible sigmoidoscopy):  Excessive amounts of blood in the stool  Significant tenderness or worsening of abdominal pains  Swelling of the abdomen that is new, acute  Fever of 100F or higher  Following upper endoscopy (EGD)  Vomiting of blood or coffee ground material  New chest pain or pain under the shoulder blades  Painful or persistently difficult swallowing  New shortness of breath  Fever of 100F or higher  Black, tarry-looking stools  For urgent or emergent issues, a gastroenterologist can be reached at any hour by calling [phone]. Do not use MyChart messaging for urgent concerns.    DIET:  We do recommend a small meal at first, but  then you may proceed to your regular diet.  Drink plenty of fluids but you should avoid alcoholic beverages for 24 hours.  ACTIVITY:  You should plan to take it easy for the rest of today and you should NOT DRIVE or use heavy machinery until tomorrow (because of the sedation medicines used during the test).    FOLLOW UP: Our staff will call the number listed on your records 48-72 hours following your procedure to check on you and address any questions or concerns that you may have regarding the information given to you following your procedure. If we do not reach you, we will leave a message.  We will attempt to reach you two times.  During this call, we will ask if you have developed any symptoms of COVID 19. If you develop any symptoms (ie: fever, flu-like symptoms, shortness of breath, cough etc.) before then, please call [phone].  If you test positive for Covid 19 in the 2 weeks post procedure, please call and report this information to [place].    If any biopsies were taken you will be contacted by phone or by letter within the next 1-3 weeks.  Please call us at [phone] if you have not heard about the biopsies in 3 weeks.    SIGNATURES/CONFIDENTIALITY: You and/or your care partner have signed paperwork which will be entered into your electronic medical record.  These signatures attest to the fact that that the information above on your After  Visit Summary has been reviewed and is understood.  Full responsibility of the confidentiality of this discharge information lies with you and/or your care-partner.    Resume medications. Information given on polyps.

## 2021-04-17 ENCOUNTER — Telehealth: Payer: Self-pay

## 2021-04-17 NOTE — Telephone Encounter (Signed)
  Follow up Call-  Call back number 04/13/2021  Post procedure Call Back phone  # [phone]  Permission to leave phone message Yes  Some recent data might be hidden     Patient questions:  Do you have a fever, pain , or abdominal swelling? No. Pain Score  0 *  Have you tolerated food without any problems? Yes.    Have you been able to return to your normal activities? Yes.    Do you have any questions about your discharge instructions: Diet   No. Medications  No. Follow up visit  No.  Do you have questions or concerns about your Care? No.  Actions: * If pain score is 4 or above: No action needed, pain <4. Have you developed a fever since your procedure? no  2.   Have you had an respiratory symptoms (SOB or cough) since your procedure? no  3.   Have you tested positive for COVID 19 since your procedure no  4.   Have you had any family members/close contacts diagnosed with the COVID 19 since your procedure?  no   If yes to any of these questions please route to Joylene John, RN and Joella Prince, RN

## 2021-04-20 ENCOUNTER — Encounter: Payer: Self-pay | Admitting: Gastroenterology

## 2021-06-01 ENCOUNTER — Emergency Department (HOSPITAL_COMMUNITY)
Admission: EM | Admit: 2021-06-01 | Discharge: 2021-06-01 | Disposition: A | Discharge status: Home / Self Care | Payer: Medicaid Other | Attending: Emergency Medicine | Admitting: Emergency Medicine

## 2021-06-01 ENCOUNTER — Encounter (HOSPITAL_COMMUNITY): Payer: Self-pay

## 2021-06-01 ENCOUNTER — Other Ambulatory Visit: Payer: Self-pay

## 2021-06-01 DIAGNOSIS — F17210 Nicotine dependence, cigarettes, uncomplicated: Secondary | ICD-10-CM | POA: Diagnosis not present

## 2021-06-01 DIAGNOSIS — K5900 Constipation, unspecified: Secondary | ICD-10-CM | POA: Insufficient documentation

## 2021-06-01 DIAGNOSIS — K6289 Other specified diseases of anus and rectum: Secondary | ICD-10-CM | POA: Insufficient documentation

## 2021-06-01 MED ORDER — FLEET ENEMA 7-19 GM/118ML RE ENEM
1.0000 | ENEMA | Freq: Once | RECTAL | Status: AC
  Administered 2021-06-01: 1 via RECTAL
  Filled 2021-06-01: qty 1

## 2021-06-01 NOTE — ED Triage Notes (Signed)
Patient c/o constipation x 1 week.  Patient also c/o rectal bleeding since 1200 this AM. Patient had a fleets enema with no results.  Patient also c/o generalized abdominal pain and pressure.

## 2021-06-01 NOTE — Discharge Instructions (Addendum)
Take 8 scoops of miralax in 32oz of whatever you would like to drink.(Gatorade comes in this size) You can also use a fleets enema which you can buy over the counter at the pharmacy.  Return for worsening abdominal pain, vomiting or fever. ? ?

## 2021-06-01 NOTE — ED Provider Notes (Signed)
[facility] DEPT Provider Note   CSN: [id number] Arrival date & time: 06/01/21  1523     History Chief Complaint  Patient presents with   Constipation   Abdominal Pain   Rectal Bleeding    Kaitlyn West is a 40 y.o. female.  40 yo F with a chief complaints of rectal pain and constipation.  Having trouble having a bowel meant for the past week.  Tried an enema at home with very limited success and had some pain in her rectum.  The history is provided by the patient.  Constipation Associated symptoms: abdominal pain and hematochezia   Associated symptoms: no dysuria, no fever, no nausea and no vomiting   Abdominal Pain Associated symptoms: constipation and hematochezia   Associated symptoms: no chest pain, no chills, no dysuria, no fever, no nausea, no shortness of breath and no vomiting   Rectal Bleeding Associated symptoms: abdominal pain   Associated symptoms: no dizziness, no fever and no vomiting   Illness Severity:  Moderate Onset quality:  Gradual Duration:  1 week Timing:  Constant Progression:  Worsening Chronicity:  New Associated symptoms: abdominal pain   Associated symptoms: no chest pain, no congestion, no fever, no headaches, no myalgias, no nausea, no rhinorrhea, no shortness of breath, no vomiting and no wheezing       Past Medical History:  Diagnosis Date   Acne 01/22/2011   Anemia    Headache    Migraine 04/01/2013   Reflux     Patient Active Problem List   Diagnosis Date Noted   Encounter for insertion of intrauterine contraceptive device (IUD) 02/28/2021   Subclinical hyperthyroidism 01/02/2021   Intermenstrual bleeding 12/08/2020   Decreased thyroid stimulating hormone (TSH) level 06/07/2019   Family hx of ovarian malignancy 02/26/2019   Family history of G6PD 02/26/2019   IDA (iron deficiency anemia) 10/07/2008   SMOKER 10/07/2008   Menorrhagia with regular cycle 10/07/2008    Past Surgical History:   Procedure Laterality Date   CHOLECYSTECTOMY     TUBAL LIGATION       OB History   No obstetric history on file.     Family History  Problem Relation Age of Onset   Migraines Mother    Diabetes Mellitus II Mother    Diabetes Mellitus II Father    Migraines Brother    Breast cancer Maternal Grandmother    Migraines Daughter    Colon cancer Neg Hx    Stomach cancer Neg Hx    Rectal cancer Neg Hx    Esophageal cancer Neg Hx     Social History   Tobacco Use   Smoking status: Every Day    Packs/day: 0.50    Types: Cigarettes   Smokeless tobacco: Never  Vaping Use   Vaping Use: Never used  Substance Use Topics   Alcohol use: No   Drug use: No    Home Medications Prior to Admission medications   Medication Sig Start Date End Date Taking? Authorizing Provider  cetirizine (ZYRTEC) 10 MG tablet Take 10 mg by mouth daily.    [provider]  famotidine (PEPCID) 40 MG tablet Take 1 tablet (40 mg total) by mouth in the morning. 03/07/21   Ester[place], Amy S, PA-C  ferrous sulfate 325 (65 FE) MG EC tablet Take 1 tablet (325 mg total) by mouth every other day. Patient not taking: No sig reported 12/09/20   Lenoria Chime, MD  ibuprofen (ADVIL) 600 MG tablet Take 1  tablet (600 mg total) by mouth every 6 (six) hours as needed for mild pain, moderate pain or cramping. 02/08/21   Marsala, Placido Sou, MD    Allergies    Pistachio nut (diagnostic) and Shellfish allergy  Review of Systems   Review of Systems  Constitutional:  Negative for chills and fever.  HENT:  Negative for congestion and rhinorrhea.   Eyes:  Negative for redness and visual disturbance.  Respiratory:  Negative for shortness of breath and wheezing.   Cardiovascular:  Negative for chest pain and palpitations.  Gastrointestinal:  Positive for abdominal pain, constipation and hematochezia. Negative for nausea and vomiting.  Genitourinary:  Negative for dysuria and urgency.  Musculoskeletal:  Negative for  arthralgias and myalgias.  Skin:  Negative for pallor and wound.  Neurological:  Negative for dizziness and headaches.   Physical Exam Updated Vital Signs BP (!) 101/51   Pulse 70   Temp 97.9 F (36.6 C) (Oral)   Resp 18   Ht '5\' 2"'$  (1.575 m)   Wt 70.3 kg   SpO2 100%   BMI 28.35 kg/m   Physical Exam Vitals and nursing note reviewed.  Constitutional:      General: She is not in acute distress.    Appearance: She is well-developed. She is not diaphoretic.  HENT:     Head: Normocephalic and atraumatic.  Eyes:     Pupils: Pupils are equal, round, and reactive to light.  Cardiovascular:     Rate and Rhythm: Normal rate and regular rhythm.     Heart sounds: No murmur heard.   No friction rub. No gallop.  Pulmonary:     Effort: Pulmonary effort is normal.     Breath sounds: No wheezing or rales.  Abdominal:     General: There is no distension.     Palpations: Abdomen is soft.     Tenderness: There is no abdominal tenderness.  Genitourinary:    Comments: No obvious external hemorrhoids.  Mild tenderness with palpation around the rectum.  No fissure.  No bleeding. Musculoskeletal:        General: No tenderness.     Cervical back: Normal range of motion and neck supple.  Skin:    General: Skin is warm and dry.  Neurological:     Mental Status: She is alert and oriented to person, place, and time.  Psychiatric:        Behavior: Behavior normal.    ED Results / Procedures / Treatments   Labs (all labs ordered are listed, but only abnormal results are displayed) Labs Reviewed - No data to display  EKG None  Radiology No results found.  Procedures Procedures   Medications Ordered in ED Medications  sodium phosphate (FLEET) 7-19 GM/118ML enema 1 enema (1 enema Rectal Given 06/01/21 1638)    ED Course  I have reviewed the triage vital signs and the nursing notes.  Pertinent labs & imaging results that were available during my care of the patient were reviewed by  me and considered in my medical decision making (see chart for details).    MDM Rules/Calculators/A&P                           40 yo F with a chief complaints of difficulty moving her bowels going on for the past week.  No abdominal pain no nausea or vomiting.  Tried an enema today and has had rectal pain since.  Had a enema  ordered in triage.  Patient having significant output after her enema.  Feeling much better.  Requesting discharge.  Given home instructions if this were to recur.  PCP follow-up.  5:59 PM:  I have discussed the diagnosis/risks/treatment options with the patient and believe the pt to be eligible for discharge home to follow-up with PCP. We also discussed returning to the ED immediately if new or worsening sx occur. We discussed the sx which are most concerning (e.g., sudden worsening pain, fever, inability to tolerate by mouth) that necessitate immediate return. Medications administered to the patient during their visit and any new prescriptions provided to the patient are listed below.  Medications given during this visit Medications  sodium phosphate (FLEET) 7-19 GM/118ML enema 1 enema (1 enema Rectal Given 06/01/21 1638)     The patient appears reasonably screen and/or stabilized for discharge and I doubt any other medical condition or other [facility] requiring further screening, evaluation, or treatment in the ED at this time prior to discharge.   Final Clinical Impression(s) / ED Diagnoses Final diagnoses:  Rectal pain    Rx / DC Orders ED Discharge Orders     None        Deno Etienne, DO 06/01/21 1759

## 2021-06-01 NOTE — ED Provider Notes (Signed)
Emergency Medicine Provider Triage Evaluation Note  Kaitlyn West , a 40 y.o. female  was evaluated in triage.  Pt complains of rectal pain, severe constipation   Review of Systems  Positive: bleeding Negative: Fever   Physical Exam  BP 120/61 (BP Location: Right Arm)   Pulse 86   Temp 97.9 F (36.6 C) (Oral)   Resp 18   SpO2 97%  Gen:   Awake, no distress   Resp:  Normal effort  MSK:   Moves extremities without difficulty  Other:    Medical Decision Making  Medically screening exam initiated at 3:48 PM.  Appropriate orders placed.  Kaitlyn West was informed that the remainder of the evaluation will be completed by another provider, this initial triage assessment does not replace that evaluation, and the importance of remaining in the ED until their evaluation is complete.     Fransico Meadow, PA-C 06/01/21 [place], [place], DO 06/01/21 1722

## 2021-12-17 IMAGING — MG DIGITAL SCREENING BILAT W/ CAD
4 series · 4 of 4 positions shown · non-contrast
Comparison: Previous exam(s).

CLINICAL DATA: Screening.

EXAM:
DIGITAL SCREENING BILATERAL MAMMOGRAM WITH CAD

[R CC]
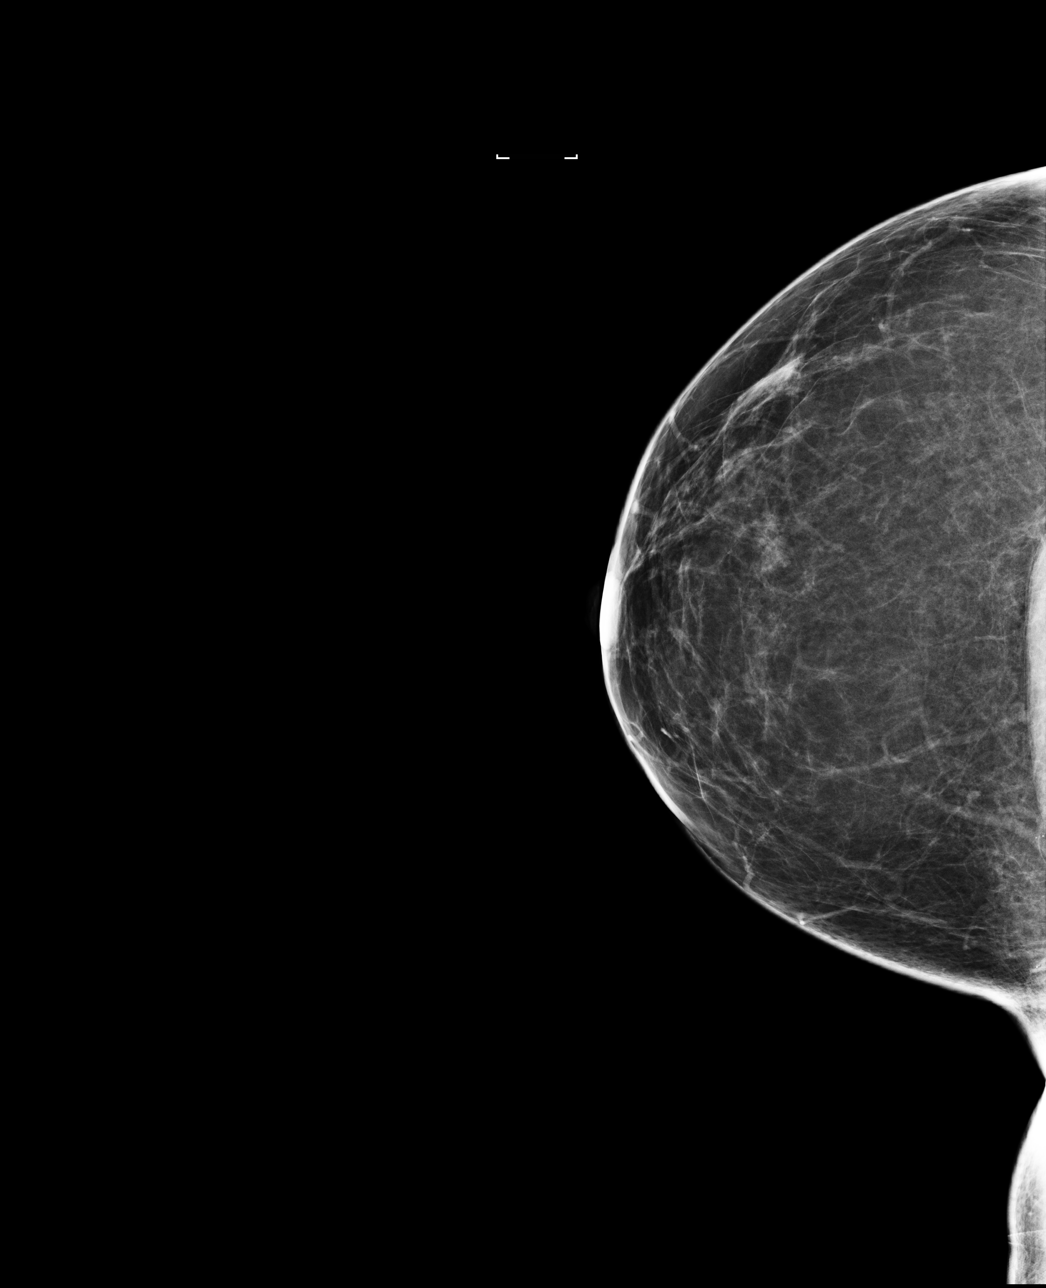

[L CC]
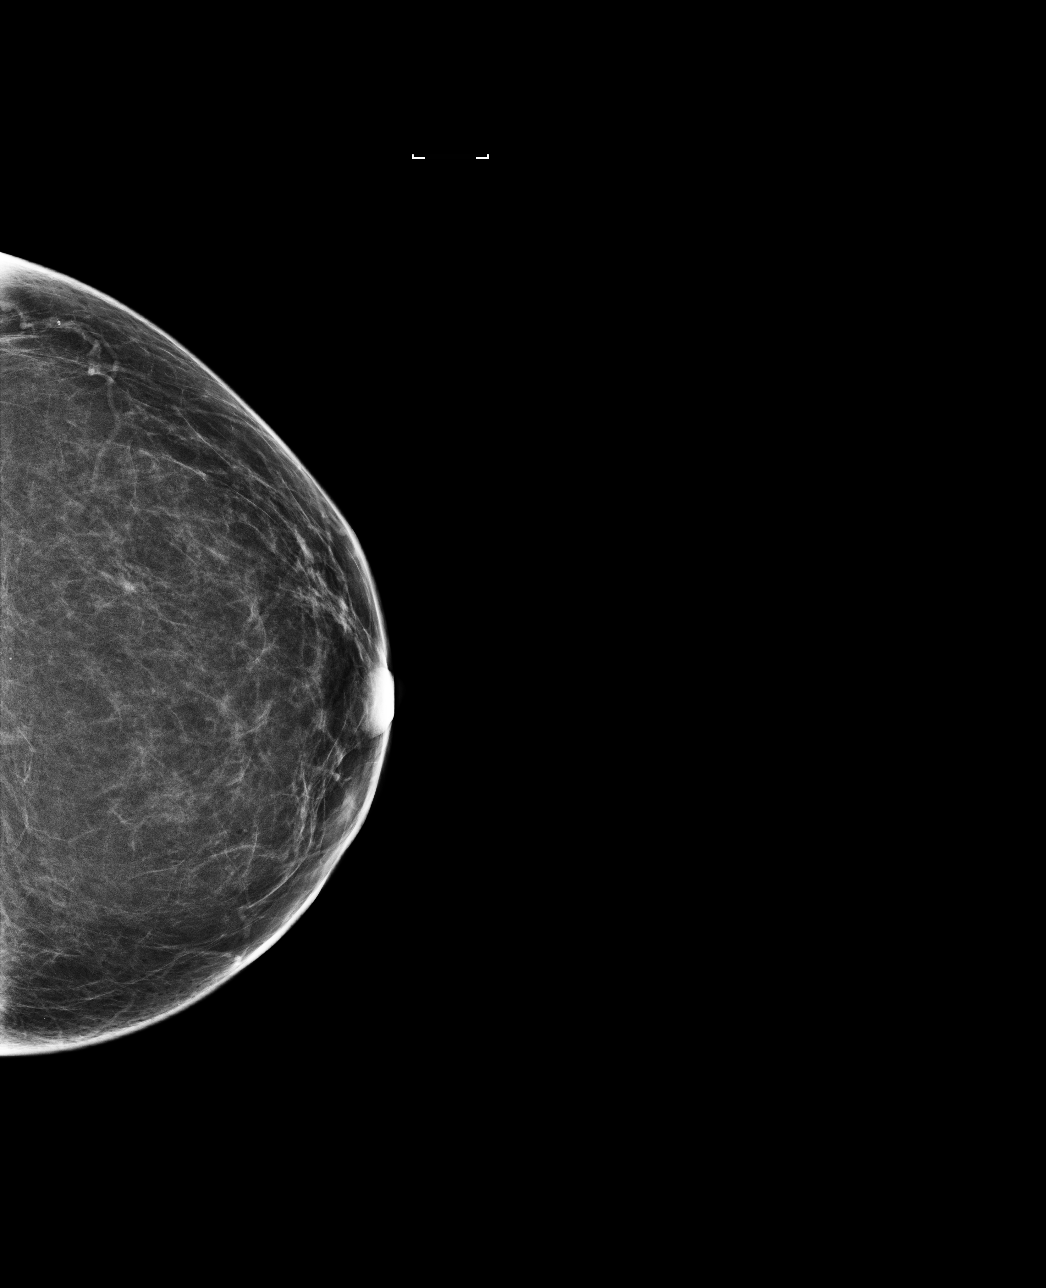

[L MLO]
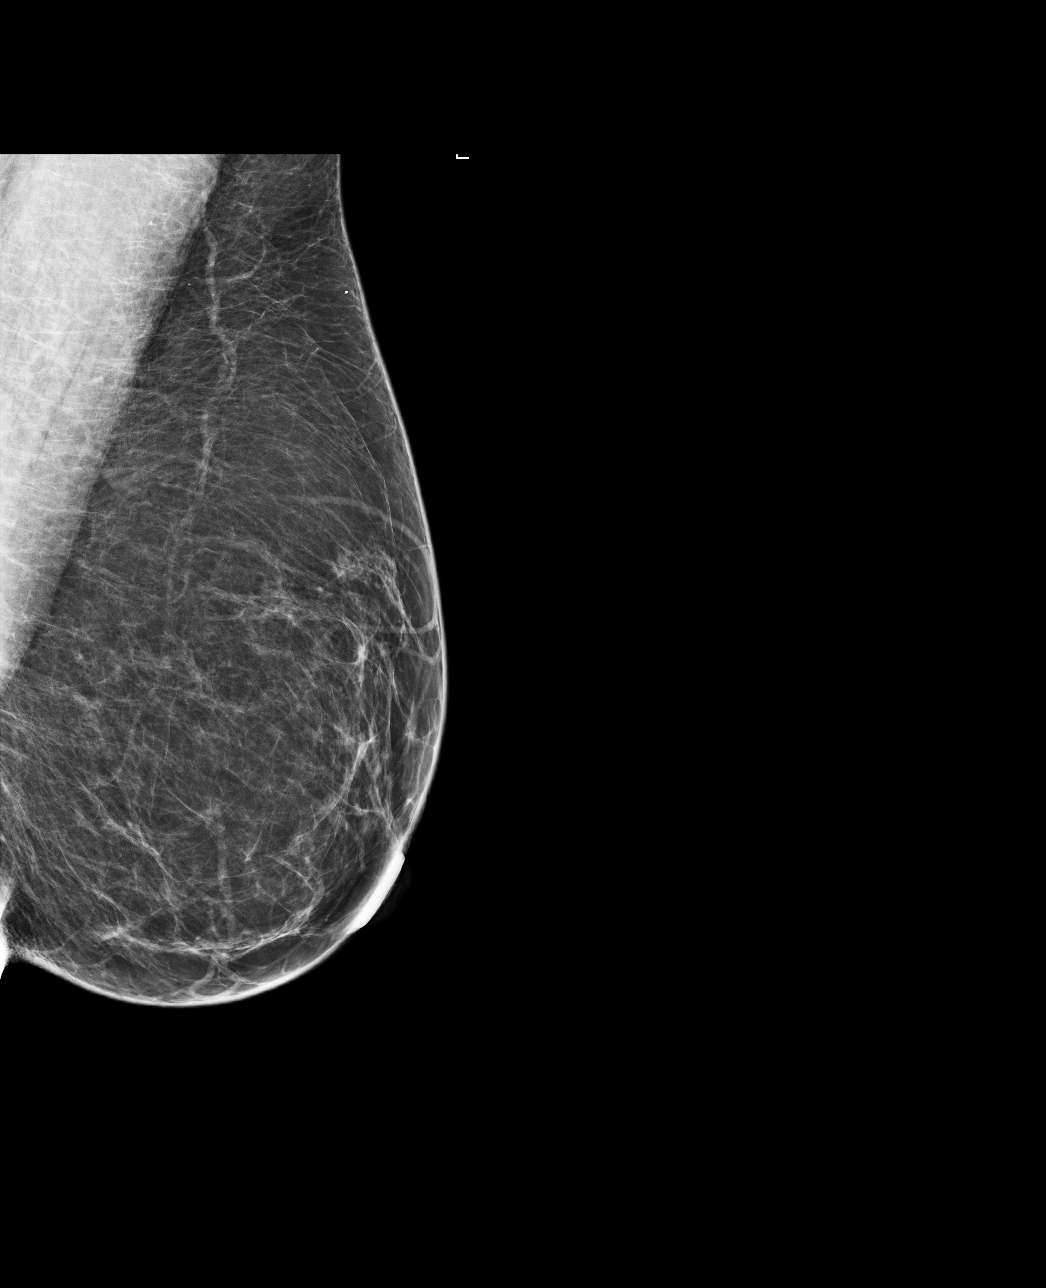

[R MLO]
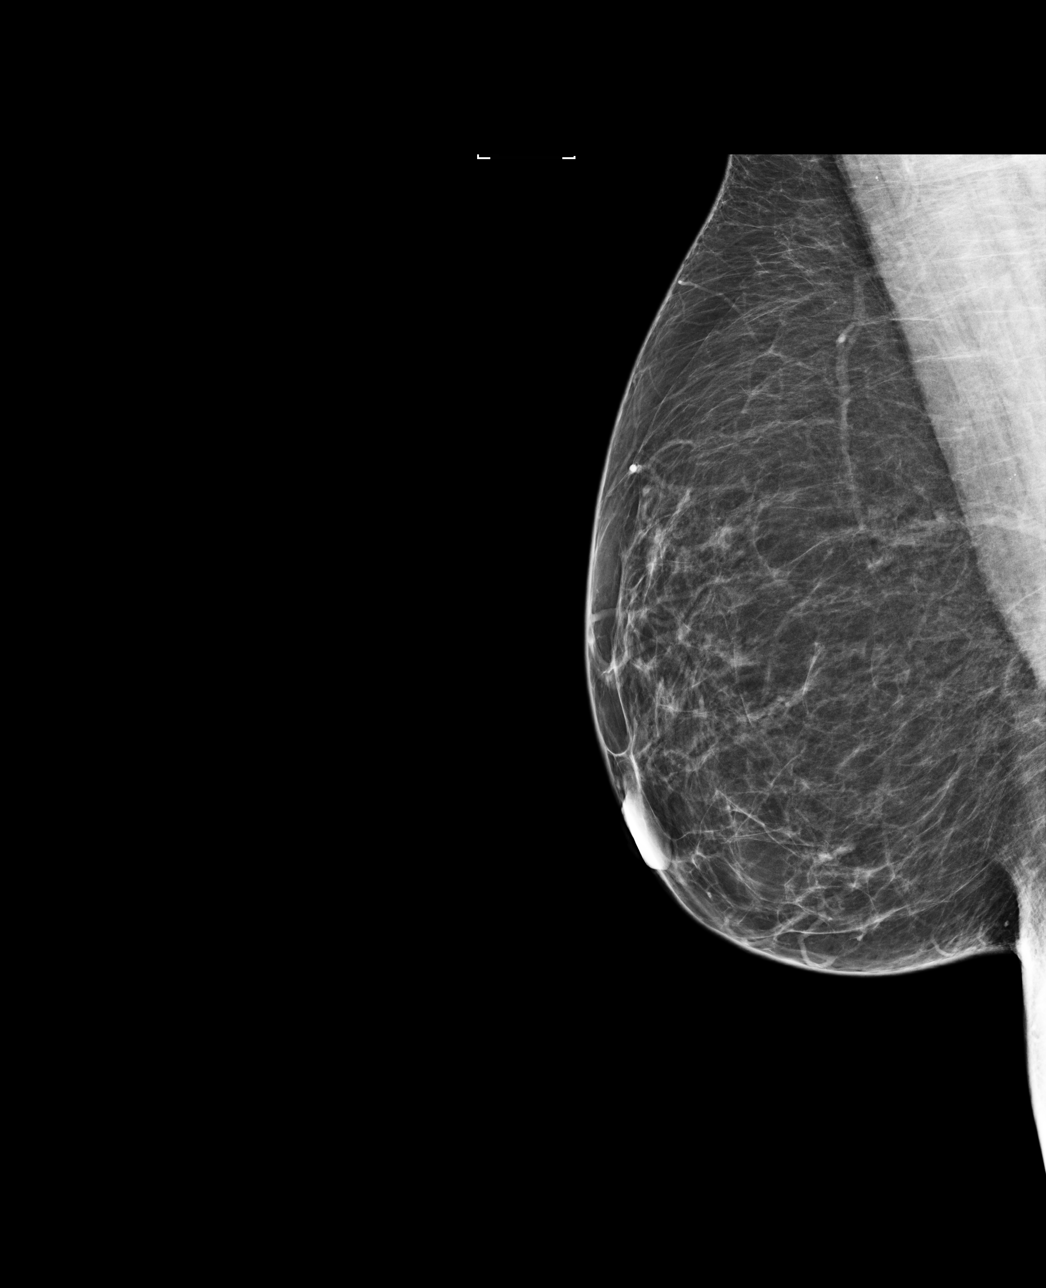

[4 of 4 positions shown; findings below may reference images not displayed]

ACR Breast Density Category b: There are scattered areas of
fibroglandular density.
FINDINGS: There are no findings suspicious for malignancy. Images were
processed with CAD.
IMPRESSION: No mammographic evidence of malignancy. A result letter of this
screening mammogram will be mailed directly to the patient.

RECOMMENDATION:
Screening mammogram in one year. (Code:AS-G-LCT)

BI-RADS CATEGORY  1: Negative.

## 2021-12-31 ENCOUNTER — Encounter: Payer: Self-pay | Admitting: Family Medicine

## 2021-12-31 ENCOUNTER — Ambulatory Visit (INDEPENDENT_AMBULATORY_CARE_PROVIDER_SITE_OTHER): Payer: Medicaid Other

## 2021-12-31 ENCOUNTER — Ambulatory Visit (INDEPENDENT_AMBULATORY_CARE_PROVIDER_SITE_OTHER): Payer: Medicaid Other | Admitting: Family Medicine

## 2021-12-31 VITALS — BP 100/71 | HR 78 | Ht 62.0 in | Wt 149.0 lb

## 2021-12-31 DIAGNOSIS — Z23 Encounter for immunization: Secondary | ICD-10-CM | POA: Diagnosis not present

## 2021-12-31 DIAGNOSIS — N921 Excessive and frequent menstruation with irregular cycle: Secondary | ICD-10-CM

## 2021-12-31 MED ORDER — FAMOTIDINE 40 MG PO TABS: 40.0000 mg | ORAL_TABLET | Freq: Every morning | ORAL | 6 refills | Status: DC

## 2021-12-31 NOTE — Patient Instructions (Signed)
We will check a CBC to monitor for anemia.  I will let you know these results when they return.  I have placed a referral for gynecology but it is reasonable to reach back out to the previous place that she went to see about other options for the ongoing menstrual bleeding which has been heavy at times.  If they are able to see you in a reasonable timeframe you should be able to get the IUD removed there.  If they cannot please call back to our clinic and asked to be placed on the gyn clinic that we have here to get the IUD removed.  As we discussed gynecology would have more options to discuss with you for the heavy bleeding then our gynecology clinic would have. ?

## 2021-12-31 NOTE — Progress Notes (Deleted)
? ? ?  SUBJECTIVE:  ? ?CHIEF COMPLAINT / HPI:  ? ?Contraception management: ?41 year old female presenting to discuss the possibility of removing her IUD.  She states she had a Liletta placed on 02/28/2021. She states***. ? ?PERTINENT  PMH / PSH: *** ? ?OBJECTIVE:  ? ?BP 100/71   Pulse 78   Ht '5\' 2"'$  (1.575 m)   Wt 149 lb (67.6 kg)   SpO2 100%   BMI 27.25 kg/m?  *** ? ?General: NAD, pleasant, able to participate in exam ?Cardiac: RRR, no murmurs. ?Respiratory: CTAB, normal effort, No wheezes, rales or rhonchi ?Abdomen: Bowel sounds present, nontender, nondistended, no hepatosplenomegaly. ?Extremities: no edema or cyanosis. ?Skin: warm and dry, no rashes noted ?Neuro: alert, no obvious focal deficits ?Psych: Normal affect and mood ? ?ASSESSMENT/PLAN:  ? ?No problem-specific Assessment & Plan notes found for this encounter. ?  ?Contraception management: ?Discussed full range of modalities for management for contraception including condom use, OCPs, Depo-Provera, Nexplanon, the copper and hormonal IUD options.  Also discussed tubal ligation.  Patient states she***children in the future.  Patient ultimately decides*** ? ?Lurline Del, DO ?[place]  ? ? ?{    This will disappear when note is signed, click to select method of visit    :1} ?

## 2021-12-31 NOTE — Progress Notes (Signed)
? ? ?  SUBJECTIVE:  ? ?CHIEF COMPLAINT / HPI:  ?Contraception management: ?41 year old female with a history of iron deficiency anemia presenting to discuss the possibility of removing her IUD.  She states she had a Liletta placed on 02/28/2021 to control heavy and irregular menstrual bleeding. She states that while her period has been lighter, the IUD has not worked to control her irregular bleeding. Prior to the IUD placement, she was having heavy but regular periods. Now, her cycles last for up to 14 days, and that she maybe has 7 days between periods. She denies clots. Her last hemoglobin on 03/07/2021 was 11.1. She endorses fatigue, palpitations, and finger cramping. She says she no longer chews ice, and denies any tingling in her extremities. She is not adherent with her iron pills due to GI side effects. The patient endorses waxing her facial hair and has a daughter with PCOS.  ? ?Reflux:  ?She experiences regular reflux and would like to restart a PPI. She had an upper endoscopy on 04/13/2021 notable for "Gastroesophageal flap valve classified as Hill Grade III (minimal fold, loose to endoscope, hiatal hernia likely)" and stomach biopsy notable for gastritis but negative for H pylori.  ? ? ?PERTINENT  PMH / PSH:  ? ?Surgical history notable for tubal ligation ? ?Family history of PCOS (daughter) and G6PD deficiency (daughter and father),  ? ? ?OBJECTIVE:  ? ?BP 100/71   Pulse 78   Ht '5\' 2"'$  (1.575 m)   Wt 149 lb (67.6 kg)   SpO2 100%   BMI 27.25 kg/m?   ? ? ?ASSESSMENT/PLAN:  ? ?1. Menorrhagia with irregular cycle ? ?Patient would like IUD removed and does not desire replacement contraception given prior tubal ligation. Current IUD was placed to control menstrual bleeding but was ineffective in doing so, so patient would like it removed. Soonest appointment at clinic where IUD was placed was 02/2022, and so we have placed a referral to our gyn clinic for IUD removal. ?Discussed menstrual irregularities may be  related to menopause transition. Given current smoking status, do not recommend trying to regulate menstrual cycles with OCPs. Advised patient to follow-up with gynecology to discuss endometrial ablation. Will recheck CBC.    ?- CBC ?- Ambulatory referral to Gynecology ? ?2. Reflux ?Previously managed with famotidine '40mg'$ , will re-order. ? ?Annia Belt, Medical Student ?[place]  ?

## 2021-12-31 NOTE — Progress Notes (Signed)
? ? ?  SUBJECTIVE:  ? ?CHIEF COMPLAINT / HPI:  ?Contraception management: ?41 year old female with a history of iron deficiency anemia presenting to discuss the possibility of removing her IUD.  She states she had a Liletta placed on 02/28/2021 to control heavy and irregular menstrual bleeding. She states that while her period has been lighter, the IUD has not worked to control her irregular bleeding. Prior to the IUD placement, she was having heavy but regular periods. Now, her cycles last for up to 14 days, and that she maybe has 7 days between periods. She denies clots. Her last hemoglobin on 03/07/2021 was 11.1. She endorses fatigue, palpitations, and finger cramping. She says she no longer chews ice, and denies any tingling in her extremities. She is not adherent with her iron pills due to GI side effects. The patient endorses waxing her facial hair and has a daughter with PCOS.  She states she is a current smoker. ? ?Reflux:  ?She experiences regular reflux and would like to restart a PPI. She had an upper endoscopy on 04/13/2021 notable for "Gastroesophageal flap valve classified as Hill Grade III (minimal fold, loose to endoscope, hiatal hernia likely)" and stomach biopsy notable for gastritis but negative for H pylori.  ? ?PERTINENT  PMH / PSH:  ?Surgical history notable for tubal ligation ? ?Family history of PCOS (daughter) and G6PD deficiency (daughter and father),  ? ? ?OBJECTIVE:  ? ?BP 100/71   Pulse 78   Ht '5\' 2"'$  (1.575 m)   Wt 149 lb (67.6 kg)   SpO2 100%   BMI 27.25 kg/m?   ? ?General: NAD, pleasant, able to participate in exam ?Cardiac: RRR, no murmurs. ?Respiratory: CTAB, normal effort ?Psych: Normal affect and mood  ? ?ASSESSMENT/PLAN:  ? ?1. Menorrhagia with irregular cycle ?Patient would like IUD removed and does not desire replacement contraception given prior tubal ligation. Current IUD was placed to control menstrual bleeding but was ineffective in doing so, so patient would like it removed.  Soonest appointment at clinic where IUD was placed was 02/2022, and so we have placed a referral to our gyn clinic for IUD removal. ?Discussed menstrual irregularities may be related to menopause transition. Given current smoking status, do not recommend trying to regulate menstrual cycles with OCPs. Advised patient to follow-up with gynecology to discuss options for menorrhagia including endometrial ablation. Will recheck CBC.    ?- CBC ?- Ambulatory referral to Gynecology ? ?2. Reflux ?Previously managed with famotidine '40mg'$ , will re-order. ? ? ?Resident attestation: ?I agree with the documentation of medical student above. I have made adjustments to his note as appropriate. I have seen the patient and performed physical exam on the patient consistent with his documented physical exam above. ? ?Lurline Del, DO  ?

## 2022-01-01 LAB — CBC
Hematocrit: 37.6 % (ref 34.0–46.6)
Hemoglobin: 11.7 g/dL (ref 11.1–15.9)
MCH: 27.3 pg (ref 26.6–33.0)
MCHC: 31.1 g/dL — ABNORMAL LOW (ref 31.5–35.7)
MCV: 88 fL (ref 79–97)
Platelets: 419 10*3/uL (ref 150–450)
RBC: 4.29 x10E6/uL (ref 3.77–5.28)
RDW: 14.8 % (ref 11.7–15.4)
WBC: 7.7 10*3/uL (ref 3.4–10.8)

## 2022-01-02 ENCOUNTER — Other Ambulatory Visit: Payer: Self-pay

## 2022-01-02 MED ORDER — CETIRIZINE HCL 10 MG PO TABS: 10.0000 mg | ORAL_TABLET | Freq: Every day | ORAL | 1 refills | Status: DC

## 2022-01-11 ENCOUNTER — Ambulatory Visit (INDEPENDENT_AMBULATORY_CARE_PROVIDER_SITE_OTHER): Payer: Medicaid Other | Admitting: Student

## 2022-01-11 DIAGNOSIS — Z30432 Encounter for removal of intrauterine contraceptive device: Secondary | ICD-10-CM

## 2022-01-11 NOTE — Progress Notes (Signed)
No visit- canceled and rescheduled for gynecology clinic. ?

## 2022-01-15 NOTE — Progress Notes (Signed)
? ? ?  SUBJECTIVE:  ? ?CHIEF COMPLAINT / HPI:  ? ?IUD removal: 41 yo woman with menorrhagia presents for IUD removal. She has a history of bilateral tubal ligation, had liletta IUD placed due to menorrhagia and significant anemia. After IUD placed in 02/2021, her bleeding decreased somewhat, but she still has heavy, long periods (about 14 days). She is >35 yo, current smoker, OCPs are not recommended. She has not tried ibuprofen yet. She saw Dr. Vanessa [place] on 12/31/21 who referred her to gyn for consultation of ablation. CBC on 4/3 reassuringly normal, hgb 11.7 from previous baseline of 8. ? ?HC maintenance: pap smears 2011, 2014, 2017, 2020 all NILM and 2020, 2017 cotesting HPV negative. Next pap smear due May 2025. ? ?PERTINENT  PMH / PSH: BTL ? ?OBJECTIVE:  ? ?BP 100/60   Pulse 95   Ht '5\' 2"'$  (1.575 m)   Wt 151 lb 8 oz (68.7 kg)   SpO2 99%   BMI 27.71 kg/m?   ?Nursing note and vitals reviewed ?GEN: appears younger than stated age, AAW, resting comfortably in chair, NAD, WNWD ?PELVIC:  Normal appearing external female genitalia, normal vaginal epithelium, no abnormal discharge. Normal appearing cervix.  ?Neuro: AOx3  ?Ext: no edema ?Psych: Pleasant and appropriate  ? ?ASSESSMENT/PLAN:  ? ?Encounter for removal of intrauterine contraceptive device (IUD) ?PROCEDURE NOTE: IUD removal ?Patient given informed consent for IUD removal. She is aware this will stop the birth control method provided by the IUD immediately, she has back up of s/p BTL. Informed consent given and signed copy in the chart .Appropriate time out taken. ?Patient placed in the lithotomy position and the cervix brought into view using speculum. The IUD strings were identified coming from the cervical os. These strings were grasped with ring forceps, and the IUD withdrawn gently from the uterus. There were no complications and no blood loss. Patient tolerated the procedure well. ? ? ? ?Menorrhagia with regular cycle ?Recommend patient try ibuprofen 600  mg q6h x3d at start of next cycle while awaiting gynecology consultation ?  ? ? ?Gladys Damme, MD ?[place]  ? ?

## 2022-01-16 ENCOUNTER — Encounter: Payer: Self-pay | Admitting: Family Medicine

## 2022-01-16 ENCOUNTER — Ambulatory Visit: Payer: Medicaid Other | Admitting: Family Medicine

## 2022-01-16 DIAGNOSIS — N920 Excessive and frequent menstruation with regular cycle: Secondary | ICD-10-CM

## 2022-01-16 DIAGNOSIS — Z30432 Encounter for removal of intrauterine contraceptive device: Secondary | ICD-10-CM | POA: Diagnosis present

## 2022-01-16 NOTE — Patient Instructions (Signed)
It was a pleasure to see you today! ? ?You had an IUD removed today, there should be no bleeding or spotting ?The next time your cycle starts or you have bleeding, I recommend trying ibuprofen to stop or slow down heavy bleeding: take 600 mg (3 pills of ibuprofen) every 6 hours for 3 days ?Follow up with GYN regarding consultation for heavy menstrual bleeding ? ?Be Well, ? ?Dr. Chauncey Reading ? ?

## 2022-01-16 NOTE — Assessment & Plan Note (Signed)
PROCEDURE NOTE: IUD removal ?Patient given informed consent for IUD removal. She is aware this will stop the birth control method provided by the IUD immediately, she has back up of s/p BTL. Informed consent given and signed copy in the chart .Appropriate time out taken. ?Patient placed in the lithotomy position and the cervix brought into view using speculum. The IUD strings were identified coming from the cervical os. These strings were grasped with ring forceps, and the IUD withdrawn gently from the uterus. There were no complications and no blood loss. Patient tolerated the procedure well. ? ? ?

## 2022-01-16 NOTE — Assessment & Plan Note (Signed)
Recommend patient try ibuprofen 600 mg q6h x3d at start of next cycle while awaiting gynecology consultation ?

## 2022-01-31 ENCOUNTER — Other Ambulatory Visit: Payer: Self-pay | Admitting: Family Medicine

## 2022-01-31 DIAGNOSIS — Z1231 Encounter for screening mammogram for malignant neoplasm of breast: Secondary | ICD-10-CM

## 2022-03-05 ENCOUNTER — Encounter: Payer: Self-pay | Admitting: *Deleted

## 2022-03-07 ENCOUNTER — Ambulatory Visit (INDEPENDENT_AMBULATORY_CARE_PROVIDER_SITE_OTHER): Payer: Medicaid Other | Admitting: Obstetrics & Gynecology

## 2022-03-07 ENCOUNTER — Encounter: Payer: Self-pay | Admitting: Obstetrics & Gynecology

## 2022-03-07 VITALS — BP 114/63 | HR 87 | Wt 141.5 lb

## 2022-03-07 DIAGNOSIS — N920 Excessive and frequent menstruation with regular cycle: Secondary | ICD-10-CM

## 2022-03-08 NOTE — Progress Notes (Signed)
Patient ID: Kaitlyn West, female   DOB: Dec 24, 1980, 41 y.o.   MRN: [id number]  Chief Complaint  Patient presents with   Menorrhagia    HPI Kaitlyn West is a 41 y.o. female.  No obstetric history on file. Patient's last menstrual period was 02/17/2022 (approximate). Patient is s/p BTL and had menorrhagia which was managed with Mirena and she was followed by [facility] medicine. She continued to have irregular bleeding and elected to have the IUD removed in April. Menses are regular but heavier now. She would like to have endometrial ablation.  HPI  Past Medical History:  Diagnosis Date   Acne 01/22/2011   Anemia    Headache    Migraine 04/01/2013   Reflux     Past Surgical History:  Procedure Laterality Date   CHOLECYSTECTOMY     TUBAL LIGATION      Family History  Problem Relation Age of Onset   Migraines Mother    Diabetes Mellitus II Mother    Diabetes Mellitus II Father    Migraines Brother    Breast cancer Maternal Grandmother    Migraines Daughter    Colon cancer Neg Hx    Stomach cancer Neg Hx    Rectal cancer Neg Hx    Esophageal cancer Neg Hx     Social History Social History   Tobacco Use   Smoking status: Every Day    Packs/day: 0.50    Types: Cigarettes   Smokeless tobacco: Never  Vaping Use   Vaping Use: Never used  Substance Use Topics   Alcohol use: No   Drug use: No    Allergies  Allergen Reactions   Pistachio Nut (Diagnostic)    Shellfish Allergy Itching    Current Outpatient Medications  Medication Sig Dispense Refill   cetirizine (ZYRTEC) 10 MG tablet Take 1 tablet (10 mg total) by mouth daily. 90 tablet 1   famotidine (PEPCID) 40 MG tablet Take 1 tablet (40 mg total) by mouth in the morning. 30 tablet 6   ibuprofen (ADVIL) 600 MG tablet Take 1 tablet (600 mg total) by mouth every 6 (six) hours as needed for mild pain, moderate pain or cramping. 30 tablet 2   ferrous sulfate 325 (65 FE) MG EC tablet Take 1 tablet (325 mg total) by mouth  every other day. (Patient not taking: Reported on 03/30/2021) 30 tablet 3   No current facility-administered medications for this visit.    Review of Systems Review of Systems  Constitutional: Negative.   Gastrointestinal: Negative.   Genitourinary:  Positive for menstrual problem. Negative for dyspareunia, frequency, vaginal bleeding and vaginal discharge.    Blood pressure 114/63, pulse 87, weight 141 lb 8 oz (64.2 kg), last menstrual period 02/17/2022.  Physical Exam Physical Exam Vitals and nursing note reviewed.  Constitutional:      Appearance: Normal appearance.  Cardiovascular:     Rate and Rhythm: Normal rate.  Pulmonary:     Effort: Pulmonary effort is normal.  Neurological:     Mental Status: She is alert.  Psychiatric:        Mood and Affect: Mood normal.        Behavior: Behavior normal.     Data Reviewed CLINICAL DATA:  Inter cycle bleeding   EXAM: TRANSABDOMINAL AND TRANSVAGINAL ULTRASOUND OF PELVIS   TECHNIQUE: Both transabdominal and transvaginal ultrasound examinations of the pelvis were performed. Transabdominal technique was performed for global imaging of the pelvis including uterus, ovaries, adnexal regions, and pelvic cul-de-sac.  It was necessary to proceed with endovaginal exam following the transabdominal exam to visualize the endometrium.   COMPARISON:  None   FINDINGS: Uterus   Measurements: 11.3 x 4.8 x 7.2 cm = volume: 205 mL. No fibroids or other mass visualized.   Endometrium   Thickness: 7 mm.  No focal abnormality visualized.   Right ovary   Measurements: 3.0 x 1.7 x 1.6 cm = volume: 4.4 mL. Normal appearance/no adnexal mass.   Left ovary   Measurements: 3.7 x 2.6 x 2.9 cm = volume: 14.6 mL. Normal appearance/no adnexal mass.   Other findings   No abnormal free fluid.   IMPRESSION: Negative pelvic ultrasound.     Electronically Signed   By: Julian Hy M.D.   On: 12/18/2020  16:51  Assessment Menorrhagia s/p BTL and she is interested in ablation  Plan RTC for endometrial ablation and schedule procedure after reviewing information    Emeterio Reeve 03/08/2022, 11:45 AM

## 2022-03-18 ENCOUNTER — Ambulatory Visit
Admission: RE | Admit: 2022-03-18 | Discharge: 2022-03-18 | Disposition: A | Discharge status: Home / Self Care | Payer: Medicaid Other | Source: Ambulatory Visit | Attending: Family Medicine | Admitting: Family Medicine

## 2022-03-18 DIAGNOSIS — Z1231 Encounter for screening mammogram for malignant neoplasm of breast: Secondary | ICD-10-CM

## 2022-04-19 ENCOUNTER — Ambulatory Visit (INDEPENDENT_AMBULATORY_CARE_PROVIDER_SITE_OTHER): Payer: Medicaid Other | Admitting: Obstetrics & Gynecology

## 2022-04-19 ENCOUNTER — Other Ambulatory Visit (HOSPITAL_COMMUNITY)
Admission: RE | Admit: 2022-04-19 | Discharge: 2022-04-19 | Disposition: A | Discharge status: Home / Self Care | Payer: Medicaid Other | Source: Ambulatory Visit | Attending: Obstetrics & Gynecology | Admitting: Obstetrics & Gynecology

## 2022-04-19 ENCOUNTER — Encounter: Payer: Self-pay | Admitting: Obstetrics & Gynecology

## 2022-04-19 ENCOUNTER — Other Ambulatory Visit: Payer: Self-pay

## 2022-04-19 VITALS — BP 118/68 | HR 99 | Wt 142.0 lb

## 2022-04-19 DIAGNOSIS — N923 Ovulation bleeding: Secondary | ICD-10-CM | POA: Diagnosis not present

## 2022-04-19 DIAGNOSIS — N920 Excessive and frequent menstruation with regular cycle: Secondary | ICD-10-CM | POA: Diagnosis present

## 2022-04-19 DIAGNOSIS — Z3202 Encounter for pregnancy test, result negative: Secondary | ICD-10-CM

## 2022-04-19 LAB — POCT PREGNANCY, URINE: Preg Test, Ur: NEGATIVE

## 2022-04-19 NOTE — Progress Notes (Signed)
Patient given informed consent, signed copy in the chart, time out was performed. Appropriate time out taken. . The patient was placed in the lithotomy position and the cervix brought into view with sterile speculum.  Portio of cervix cleansed x 2 with betadine swabs.  A tenaculum was placed in the anterior lip of the cervix.  The uterus was sounded for depth of 9-10 cm. A pipelle was introduced to into the uterus, suction created,  and an endometrial sample was obtained. All equipment was removed and accounted for.  The patient tolerated the procedure well.    Patient given post procedure instructions.  Patient desires surgical management with endometrial ablation.  The risks of surgery were discussed in detail with the patient including but not limited to: bleeding which may require transfusion or reoperation; infection which may require prolonged hospitalization or re-hospitalization and antibiotic therapy; injury to bowel, bladder, ureters and major vessels or other surrounding organs which may lead to other procedures; formation of adhesions; need for additional procedures including laparotomy or subsequent procedures secondary to intraoperative injury or abnormal pathology; thromboembolic phenomenon; incisional problems and other postoperative or anesthesia complications.  Patient was told that the likelihood that her condition and symptoms will be treated effectively with this surgical management was very high; the postoperative expectations were also discussed in detail. The patient also understands the alternative treatment options which were discussed in full. All questions were answered.  She was told that she will be contacted by our surgical scheduler regarding the time and date of her surgery; routine preoperative instructions will be given to her by the preoperative nursing team. Printed patient education handouts about the procedure were given to the patient to review at home.   Woodroe Mode,  MD

## 2022-04-22 LAB — SURGICAL PATHOLOGY

## 2022-05-20 ENCOUNTER — Other Ambulatory Visit: Payer: Self-pay | Admitting: Obstetrics & Gynecology

## 2022-05-20 ENCOUNTER — Other Ambulatory Visit: Payer: Self-pay

## 2022-05-20 DIAGNOSIS — N920 Excessive and frequent menstruation with regular cycle: Secondary | ICD-10-CM

## 2022-05-20 NOTE — Progress Notes (Signed)
Patient was called with instructions and questions for the surgery day. Patient denied any contact with somebody tested positive for COVID or with symptoms of COVID Patient verbally denies any shortness of breath, fever, cough and chest pain during phone call   -------------  SDW INSTRUCTIONS given:  Your procedure is scheduled on Tuesday, August 22nd, 2023.  Report to [facility] Main Entrance "A" at 07:45 A.M., and check in at the Admitting office.  Call this number if you have problems the morning of surgery:  [phone]   Remember:  Do not eat after midnight the night before your surgery  You may drink clear liquids until 07:00 the morning of your surgery.   Clear liquids allowed are: Water, Non-Citrus Juices (without pulp), Carbonated Beverages, Clear Tea, Black Coffee Only, and Gatorade    Take these medicines the morning of surgery with A SIP OF WATER NONE  As of today, STOP taking any Aspirin (unless otherwise instructed by your surgeon) Aleve, Naproxen, Ibuprofen, Motrin, Advil, Goody's, BC's, all herbal medications, fish oil, and all vitamins.  The day of surgery:                     Do not wear jewelry, make up, or nail polish            Do not wear lotions, powders, perfumes or deodorant.            Do not shave 48 hours prior to surgery.              Do not bring valuables to the hospital.            [facility] is not responsible for any belongings or valuables.  Do NOT Smoke (Tobacco/Vaping) 24 hours prior to your procedure If you use a CPAP at night, you may bring all equipment for your overnight stay.   Contacts, glasses, dentures or bridgework may not be worn into surgery.      For patients admitted to the hospital, discharge time will be determined by your treatment team.   Patients discharged the day of surgery will not be allowed to drive home, and someone needs to stay with them for 24 hours.    Special instructions:   [place]- Preparing For  Surgery  Before surgery, you can play an important role. Because skin is not sterile, your skin needs to be as free of germs as possible. You can reduce the number of germs on your skin by washing with CHG (chlorahexidine gluconate) Soap before surgery.  CHG is an antiseptic cleaner which kills germs and bonds with the skin to continue killing germs even after washing.    Oral Hygiene is also important to reduce your risk of infection.  Remember - BRUSH YOUR TEETH THE MORNING OF SURGERY WITH YOUR REGULAR TOOTHPASTE  Please do not use if you have an allergy to CHG or antibacterial soaps. If your skin becomes reddened/irritated stop using the CHG.  Do not shave (including legs and underarms) for at least 48 hours prior to first CHG shower. It is OK to shave your face.  Please follow these instructions carefully.   Shower the NIGHT BEFORE SURGERY and the MORNING OF SURGERY with DIAL Soap.   Pat yourself dry with a CLEAN TOWEL.  Wear CLEAN PAJAMAS to bed the night before surgery  Place CLEAN SHEETS on your bed the night of your first shower and DO NOT SLEEP WITH PETS.   Day of Surgery: Please  shower morning of surgery  Wear Clean/Comfortable clothing the morning of surgery Do not apply any deodorants/lotions.   Remember to brush your teeth WITH YOUR REGULAR TOOTHPASTE.   Questions were answered. Patient verbalized understanding of instructions.

## 2022-05-21 ENCOUNTER — Ambulatory Visit (HOSPITAL_BASED_OUTPATIENT_CLINIC_OR_DEPARTMENT_OTHER): Payer: Medicaid Other | Admitting: Anesthesiology

## 2022-05-21 ENCOUNTER — Encounter (HOSPITAL_COMMUNITY)
Admission: RE | Disposition: A | Discharge status: Home / Self Care | Payer: Self-pay | Source: Ambulatory Visit | Attending: Obstetrics & Gynecology

## 2022-05-21 ENCOUNTER — Ambulatory Visit (HOSPITAL_COMMUNITY): Payer: Medicaid Other | Admitting: Anesthesiology

## 2022-05-21 ENCOUNTER — Other Ambulatory Visit: Payer: Self-pay

## 2022-05-21 ENCOUNTER — Encounter (HOSPITAL_COMMUNITY): Payer: Self-pay | Admitting: Obstetrics & Gynecology

## 2022-05-21 ENCOUNTER — Ambulatory Visit (HOSPITAL_COMMUNITY)
Admission: RE | Admit: 2022-05-21 | Discharge: 2022-05-21 | Disposition: A | Discharge status: Home / Self Care | Payer: Medicaid Other | Source: Ambulatory Visit | Attending: Obstetrics & Gynecology | Admitting: Obstetrics & Gynecology

## 2022-05-21 DIAGNOSIS — N923 Ovulation bleeding: Secondary | ICD-10-CM

## 2022-05-21 DIAGNOSIS — N939 Abnormal uterine and vaginal bleeding, unspecified: Secondary | ICD-10-CM | POA: Diagnosis not present

## 2022-05-21 DIAGNOSIS — F17210 Nicotine dependence, cigarettes, uncomplicated: Secondary | ICD-10-CM | POA: Diagnosis not present

## 2022-05-21 DIAGNOSIS — N920 Excessive and frequent menstruation with regular cycle: Secondary | ICD-10-CM | POA: Diagnosis not present

## 2022-05-21 DIAGNOSIS — K219 Gastro-esophageal reflux disease without esophagitis: Secondary | ICD-10-CM | POA: Insufficient documentation

## 2022-05-21 HISTORY — PX: ENDOMETRIAL ABLATION: SHX621

## 2022-05-21 LAB — POCT PREGNANCY, URINE: Preg Test, Ur: NEGATIVE

## 2022-05-21 LAB — CBC
HCT: 32.9 % — ABNORMAL LOW (ref 36.0–46.0)
Hemoglobin: 9.8 g/dL — ABNORMAL LOW (ref 12.0–15.0)
MCH: 24.2 pg — ABNORMAL LOW (ref 26.0–34.0)
MCHC: 29.8 g/dL — ABNORMAL LOW (ref 30.0–36.0)
MCV: 81.2 fL (ref 80.0–100.0)
Platelets: 495 10*3/uL — ABNORMAL HIGH (ref 150–400)
RBC: 4.05 MIL/uL (ref 3.87–5.11)
RDW: 18.3 % — ABNORMAL HIGH (ref 11.5–15.5)
WBC: 7.1 10*3/uL (ref 4.0–10.5)
nRBC: 0 % (ref 0.0–0.2)

## 2022-05-21 LAB — POCT I-STAT, CHEM 8
BUN: 11 mg/dL (ref 6–20)
Calcium, Ion: 1.25 mmol/L (ref 1.15–1.40)
Chloride: 105 mmol/L (ref 98–111)
Creatinine, Ser: 0.7 mg/dL (ref 0.44–1.00)
Glucose, Bld: 91 mg/dL (ref 70–99)
HCT: 28 % — ABNORMAL LOW (ref 36.0–46.0)
Hemoglobin: 9.5 g/dL — ABNORMAL LOW (ref 12.0–15.0)
Potassium: 4 mmol/L (ref 3.5–5.1)
Sodium: 142 mmol/L (ref 135–145)
TCO2: 27 mmol/L (ref 22–32)

## 2022-05-21 SURGERY — ABLATION, ENDOMETRIUM
Anesthesia: General | Site: Uterus

## 2022-05-21 MED ORDER — OXYCODONE-ACETAMINOPHEN 5-325 MG PO TABS: 1.0000 | ORAL_TABLET | ORAL | 0 refills | Status: DC | PRN

## 2022-05-21 MED ORDER — LIDOCAINE 2% (20 MG/ML) 5 ML SYRINGE
INTRAMUSCULAR | Status: DC | PRN
  Administered 2022-05-21: 80 mg via INTRAVENOUS

## 2022-05-21 MED ORDER — FENTANYL CITRATE (PF) 100 MCG/2ML IJ SOLN: 25.0000 ug | INTRAMUSCULAR | Status: DC | PRN

## 2022-05-21 MED ORDER — MIDAZOLAM HCL 2 MG/2ML IJ SOLN
INTRAMUSCULAR | Status: AC
  Filled 2022-05-21: qty 2

## 2022-05-21 MED ORDER — FENTANYL CITRATE (PF) 250 MCG/5ML IJ SOLN
INTRAMUSCULAR | Status: DC | PRN
  Administered 2022-05-21 (×2): 50 ug via INTRAVENOUS

## 2022-05-21 MED ORDER — PROPOFOL 500 MG/50ML IV EMUL
INTRAVENOUS | Status: DC | PRN
  Administered 2022-05-21: 50 ug/kg/min via INTRAVENOUS

## 2022-05-21 MED ORDER — ONDANSETRON HCL 4 MG/2ML IJ SOLN: 4.0000 mg | Freq: Once | INTRAMUSCULAR | Status: DC | PRN

## 2022-05-21 MED ORDER — MIDAZOLAM HCL 2 MG/2ML IJ SOLN
INTRAMUSCULAR | Status: DC | PRN
  Administered 2022-05-21: 2 mg via INTRAVENOUS

## 2022-05-21 MED ORDER — CHLORHEXIDINE GLUCONATE 0.12 % MT SOLN
15.0000 mL | Freq: Once | OROMUCOSAL | Status: AC
  Administered 2022-05-21: 15 mL via OROMUCOSAL
  Filled 2022-05-21: qty 15

## 2022-05-21 MED ORDER — PHENYLEPHRINE 80 MCG/ML (10ML) SYRINGE FOR IV PUSH (FOR BLOOD PRESSURE SUPPORT)
PREFILLED_SYRINGE | INTRAVENOUS | Status: AC
  Filled 2022-05-21: qty 10

## 2022-05-21 MED ORDER — LACTATED RINGERS IV SOLN: INTRAVENOUS | Status: DC

## 2022-05-21 MED ORDER — POVIDONE-IODINE 10 % EX SWAB: 2.0000 | Freq: Once | CUTANEOUS | Status: DC

## 2022-05-21 MED ORDER — SODIUM CHLORIDE 0.9 % IR SOLN
Status: DC | PRN
  Administered 2022-05-21: 3000 mL

## 2022-05-21 MED ORDER — FENTANYL CITRATE (PF) 250 MCG/5ML IJ SOLN
INTRAMUSCULAR | Status: AC
  Filled 2022-05-21: qty 5

## 2022-05-21 MED ORDER — PROPOFOL 10 MG/ML IV BOLUS
INTRAVENOUS | Status: DC | PRN
  Administered 2022-05-21: 150 mg via INTRAVENOUS

## 2022-05-21 MED ORDER — BUPIVACAINE HCL (PF) 0.5 % IJ SOLN
INTRAMUSCULAR | Status: DC | PRN
  Administered 2022-05-21: 2 mL

## 2022-05-21 MED ORDER — DEXAMETHASONE SODIUM PHOSPHATE 10 MG/ML IJ SOLN
INTRAMUSCULAR | Status: DC | PRN
  Administered 2022-05-21: 10 mg via INTRAVENOUS

## 2022-05-21 MED ORDER — BUPIVACAINE HCL (PF) 0.5 % IJ SOLN
INTRAMUSCULAR | Status: AC
  Filled 2022-05-21: qty 30

## 2022-05-21 MED ORDER — ONDANSETRON HCL 4 MG/2ML IJ SOLN
INTRAMUSCULAR | Status: DC | PRN
  Administered 2022-05-21: 4 mg via INTRAVENOUS

## 2022-05-21 MED ORDER — PROPOFOL 10 MG/ML IV BOLUS
INTRAVENOUS | Status: AC
  Filled 2022-05-21: qty 20

## 2022-05-21 MED ORDER — PHENYLEPHRINE 80 MCG/ML (10ML) SYRINGE FOR IV PUSH (FOR BLOOD PRESSURE SUPPORT)
PREFILLED_SYRINGE | INTRAVENOUS | Status: DC | PRN
  Administered 2022-05-21: 80 ug via INTRAVENOUS
  Administered 2022-05-21 (×2): 160 ug via INTRAVENOUS

## 2022-05-21 MED ORDER — ORAL CARE MOUTH RINSE: 15.0000 mL | Freq: Once | OROMUCOSAL | Status: AC

## 2022-05-21 SURGICAL SUPPLY — 10 items
ABLATOR SURESOUND NOVASURE (ABLATOR) ×2 IMPLANT
CATH ROBINSON RED A/P 16FR (CATHETERS) ×2 IMPLANT
GLOVE BIO SURGEON STRL SZ 6.5 (GLOVE) ×2 IMPLANT
GLOVE SURG UNDER POLY LF SZ7 (GLOVE) ×4 IMPLANT
GOWN STRL REUS W/ TWL LRG LVL3 (GOWN DISPOSABLE) ×4 IMPLANT
GOWN STRL REUS W/TWL LRG LVL3 (GOWN DISPOSABLE) ×4
KIT PROCEDURE FLUENT (KITS) ×1 IMPLANT
PACK VAGINAL MINOR WOMEN LF (CUSTOM PROCEDURE TRAY) ×2 IMPLANT
PAD OB MATERNITY 4.3X12.25 (PERSONAL CARE ITEMS) ×2 IMPLANT
TOWEL GREEN STERILE FF (TOWEL DISPOSABLE) ×4 IMPLANT

## 2022-05-21 NOTE — H&P (Signed)
Kaitlyn West is an 41 y.o. female. No obstetric history on file. Patient has menorrhagia and is s/p BTL, scheduled for endometrial ablation today.  Pertinent Gynecological History: Menses: regular every month without intermenstrual spotting  Last pap: normal Date: 2020    Menstrual History:  Patient's last menstrual period was 05/19/2022.    Past Medical History:  Diagnosis Date   Acne 01/22/2011   Anemia    Headache    Migraine 04/01/2013   Reflux     Past Surgical History:  Procedure Laterality Date   CHOLECYSTECTOMY     TUBAL LIGATION      Family History  Problem Relation Age of Onset   Migraines Mother    Diabetes Mellitus II Mother    Diabetes Mellitus II Father    Migraines Brother    Breast cancer Maternal Grandmother    Migraines Daughter    Colon cancer Neg Hx    Stomach cancer Neg Hx    Rectal cancer Neg Hx    Esophageal cancer Neg Hx     Social History:  reports that she has been smoking cigarettes. She has been smoking an average of .5 packs per day. She has never used smokeless tobacco. She reports that she does not drink alcohol and does not use drugs.  Allergies:  Allergies  Allergen Reactions   Pistachio Nut (Diagnostic) Itching, Rash and Other (See Comments)    (Possible allergy)   Shellfish Allergy Itching    Medications Prior to Admission  Medication Sig Dispense Refill Last Dose   cetirizine (ZYRTEC) 10 MG tablet Take 1 tablet (10 mg total) by mouth daily. (Patient not taking: Reported on 04/19/2022) 90 tablet 1    famotidine (PEPCID) 40 MG tablet Take 1 tablet (40 mg total) by mouth in the morning. (Patient not taking: Reported on 04/19/2022) 30 tablet 6     Review of Systems  Constitutional: Negative.   Respiratory: Negative.    Cardiovascular: Negative.   Gastrointestinal: Negative.   Genitourinary:  Positive for menstrual problem.  Musculoskeletal: Negative.   All other systems reviewed and are negative.   Blood pressure (!)  104/57, pulse 73, temperature 98.1 F (36.7 C), temperature source Oral, resp. rate 17, height '5\' 2"'$  (1.575 m), weight 59 kg, last menstrual period 05/19/2022, SpO2 100 %. Physical Exam Vitals and nursing note reviewed.  Constitutional:      Appearance: Normal appearance.  Cardiovascular:     Rate and Rhythm: Normal rate.  Pulmonary:     Effort: Pulmonary effort is normal.  Abdominal:     General: Abdomen is flat.  Skin:    General: Skin is warm and dry.  Neurological:     Mental Status: She is alert.  Psychiatric:        Mood and Affect: Mood normal.        Behavior: Behavior normal.     No results found for this or any previous visit (from the past 24 hour(s)).  No results found.  Assessment/Plan: Patient with menorrhagia for endometrial ablation with NovaSure. The risks of surgery were discussed in detail with the patient including but not limited to: bleeding which may require transfusion or reoperation; infection which may require prolonged hospitalization or re-hospitalization and antibiotic therapy; injury to bowel, bladder, ureters and major vessels or other surrounding organs which may lead to other procedures; formation of adhesions; need for additional procedures including laparotomy or subsequent procedures secondary to intraoperative injury or abnormal pathology; thromboembolic phenomenon; incisional problems and other postoperative  or anesthesia complications.  Patient was told that the likelihood that her condition and symptoms will be treated effectively with this surgical management was very high; the postoperative expectations were also discussed in detail. The patient also understands the alternative treatment options which were discussed in full. All questions were answered.     Emeterio Reeve 05/21/2022, 8:41 AM

## 2022-05-21 NOTE — Transfer of Care (Signed)
Immediate Anesthesia Transfer of Care Note  Patient: Kaitlyn West  Procedure(s) Performed: NOVASURE ENDOMETRIAL ABLATION (Uterus)  Patient Location: PACU  Anesthesia Type:General  Level of Consciousness: sedated  Airway & Oxygen Therapy: Patient Spontanous Breathing  Post-op Assessment: Report given to RN and Post -op Vital signs reviewed and stable  Post vital signs: Reviewed and stable  Last Vitals:  Vitals Value Taken Time  BP 106/54 05/21/22 1010  Temp 36.4 C 05/21/22 1010  Pulse 54 05/21/22 1012  Resp 8 05/21/22 1012  SpO2 96 % 05/21/22 1012  Vitals shown include unvalidated device data.  Last Pain:  Vitals:   05/21/22 1010  TempSrc:   PainSc: Asleep         Complications: No notable events documented.

## 2022-05-21 NOTE — Anesthesia Procedure Notes (Signed)
Procedure Name: LMA Insertion Date/Time: 05/21/2022 9:35 AM  Performed by: Colin Benton, CRNAPre-anesthesia Checklist: Patient identified, Emergency Drugs available, Suction available and Patient being monitored Patient Re-evaluated:Patient Re-evaluated prior to induction Oxygen Delivery Method: Circle system utilized Preoxygenation: Pre-oxygenation with 100% oxygen Induction Type: IV induction Ventilation: Mask ventilation without difficulty LMA: LMA inserted LMA Size: 3.0 Tube type: Oral Number of attempts: 1 Airway Equipment and Method: Oral airway Placement Confirmation: positive ETCO2 and breath sounds checked- equal and bilateral Tube secured with: Tape Dental Injury: Teeth and Oropharynx as per pre-operative assessment

## 2022-05-21 NOTE — Anesthesia Preprocedure Evaluation (Addendum)
Anesthesia Evaluation  Patient identified by MRN, date of birth, ID band Patient awake    Reviewed: Allergy & Precautions, NPO status , Patient's Chart, lab work & pertinent test results  Airway Mallampati: II  TM Distance: >3 FB Neck ROM: Full    Dental  (+) Teeth Intact, Dental Advisory Given   Pulmonary Current Smoker and Patient abstained from smoking.,    Pulmonary exam normal breath sounds clear to auscultation       Cardiovascular Exercise Tolerance: Good negative cardio ROS Normal cardiovascular exam Rhythm:Regular Rate:Normal     Neuro/Psych  Headaches,    GI/Hepatic Neg liver ROS, GERD  ,  Endo/Other  negative endocrine ROS  Renal/GU negative Renal ROS     Musculoskeletal negative musculoskeletal ROS (+)   Abdominal   Peds  Hematology negative hematology ROS (+)   Anesthesia Other Findings Day of surgery medications reviewed with the patient.  Reproductive/Obstetrics Menorrhagia                            Anesthesia Physical Anesthesia Plan  ASA: 2  Anesthesia Plan: General   Post-op Pain Management: Tylenol PO (pre-op)*   Induction: Intravenous  PONV Risk Score and Plan: 2 and Midazolam, Dexamethasone and Ondansetron  Airway Management Planned: LMA  Additional Equipment:   Intra-op Plan:   Post-operative Plan: Extubation in OR  Informed Consent: I have reviewed the patients History and Physical, chart, labs and discussed the procedure including the risks, benefits and alternatives for the proposed anesthesia with the patient or authorized representative who has indicated his/her understanding and acceptance.     Dental advisory given  Plan Discussed with: CRNA  Anesthesia Plan Comments:         Anesthesia Quick Evaluation

## 2022-05-21 NOTE — Op Note (Signed)
PREOPERATIVE DIAGNOSIS:  Irregular uterine bleeding POSTOPERATIVE DIAGNOSIS: The same PROCEDURE:  NovaSure Endometrial Ablation SURGEON:  Woodroe Mode, MD  INDICATIONS: 41 y.o. No obstetric history on file.  here for NovaSure Endometrial Ablation.  Risks of surgery were discussed with the patient including but not limited to: bleeding which may require transfusion; infection which may require antibiotics; injury to uterus leading to risk of injury to surrounding intraperitoneal organs, need for additional procedures including laparoscopy or laparotomy, and other postoperative/anesthesia complications. Written informed consent was obtained.   FINDINGS:  A 4 week size uterus.   ANESTHESIA:   General and paracervical block with 10 ml of 0.5% Marcaine INTRAVENOUS FLUIDS:  500 ml of LR ESTIMATED BLOOD LOSS:  Less than 20 ml SPECIMENS: None COMPLICATIONS:  None immediate  PROCEDURE DETAILS:  The patient was taken to the operating room where general anesthesia was administered and was found to be adequate.  After an adequate timeout was performed, she was placed in the dorsal lithotomy position and examined; then prepped and draped in the sterile manner.   Her bladder was catheterized for an unmeasured amount of clear, yellow urine. A speculum was then placed in the patient's vagina and a single tooth tenaculum was applied to the anterior lip of the cervix.  A paracervical block using 0.5% Marcaine was administered. The sound was used to obtain the  uterine cavity length measurement at 5 cm.  The cervix was dilated manually with Hegar dilators.  The NovaSure device was inserted, and a cavity width of 4 cm was determined. For 114 sec, the endometrial ablation was performed.  The tenaculum was removed from the anterior lip of the cervix, and the vaginal speculum was removed after noting good hemostasis.  The patient tolerated the procedure well and was taken to the recovery area awake, extubated and in  stable condition.  The patient will be discharged to home as per PACU criteria.  Routine postoperative instructions given.  She was prescribed Percocet, Ibuprofen .  She will follow up in the clinic for postoperative evaluation.   Woodroe Mode, MD 05/21/2022 10:05 AM

## 2022-05-21 NOTE — Anesthesia Postprocedure Evaluation (Signed)
Anesthesia Post Note  Patient: Kaitlyn West  Procedure(s) Performed: NOVASURE ENDOMETRIAL ABLATION (Uterus)     Patient location during evaluation: PACU Anesthesia Type: General Level of consciousness: awake and alert Pain management: pain level controlled Vital Signs Assessment: post-procedure vital signs reviewed and stable Respiratory status: spontaneous breathing, nonlabored ventilation, respiratory function stable and patient connected to nasal cannula oxygen Cardiovascular status: blood pressure returned to baseline and stable Postop Assessment: no apparent nausea or vomiting Anesthetic complications: no   No notable events documented.  Last Vitals:  Vitals:   05/21/22 1025 05/21/22 1040  BP: 100/66 92/63  Pulse: 73 71  Resp: 17 13  Temp:  36.7 C  SpO2: 100% 99%    Last Pain:  Vitals:   05/21/22 1040  TempSrc:   PainSc: 0-No pain                 Santa Lighter

## 2022-05-22 ENCOUNTER — Encounter (HOSPITAL_COMMUNITY): Payer: Self-pay | Admitting: Obstetrics & Gynecology

## 2022-05-27 ENCOUNTER — Telehealth: Payer: Self-pay | Admitting: Family Medicine

## 2022-05-27 NOTE — Telephone Encounter (Signed)
Returned call to pt and informed her that she no longer needs to follow 5lb restriction. She should continue pelvic rest (nothing in vagina) until her appt w/Dr. Roselie Awkward on 9/22. Pt voiced understanding and had no further questions.

## 2022-05-27 NOTE — Telephone Encounter (Signed)
Patient called in and she is wanting to know when her 5 pound restriction will be off. Patient requested a call back.

## 2022-06-21 ENCOUNTER — Encounter: Payer: Self-pay | Admitting: Obstetrics and Gynecology

## 2022-06-21 ENCOUNTER — Other Ambulatory Visit: Payer: Self-pay

## 2022-06-21 ENCOUNTER — Ambulatory Visit (INDEPENDENT_AMBULATORY_CARE_PROVIDER_SITE_OTHER): Payer: Medicaid Other | Admitting: Obstetrics and Gynecology

## 2022-06-21 ENCOUNTER — Other Ambulatory Visit (HOSPITAL_COMMUNITY)
Admission: RE | Admit: 2022-06-21 | Discharge: 2022-06-21 | Disposition: A | Discharge status: Home / Self Care | Payer: Medicaid Other | Source: Ambulatory Visit | Attending: Obstetrics & Gynecology | Admitting: Obstetrics & Gynecology

## 2022-06-21 VITALS — BP 110/72 | HR 64 | Ht 62.0 in | Wt 133.9 lb

## 2022-06-21 DIAGNOSIS — Z4889 Encounter for other specified surgical aftercare: Secondary | ICD-10-CM | POA: Insufficient documentation

## 2022-06-21 DIAGNOSIS — R350 Frequency of micturition: Secondary | ICD-10-CM

## 2022-06-21 DIAGNOSIS — Z124 Encounter for screening for malignant neoplasm of cervix: Secondary | ICD-10-CM

## 2022-06-21 LAB — POCT URINALYSIS DIPSTICK
Bilirubin, UA: NEGATIVE
Glucose, UA: NEGATIVE
Ketones, UA: NEGATIVE
Leukocytes, UA: NEGATIVE
Nitrite, UA: NEGATIVE
Protein, UA: NEGATIVE
Spec Grav, UA: 1.01 (ref 1.010–1.025)
Urobilinogen, UA: 0.2 E.U./dL
pH, UA: 7.5 (ref 5.0–8.0)

## 2022-06-21 MED ORDER — CEPHALEXIN 500 MG PO CAPS: 500.0000 mg | ORAL_CAPSULE | Freq: Two times a day (BID) | ORAL | 0 refills | Status: DC

## 2022-06-21 NOTE — Progress Notes (Signed)
    Subjective:    Kaitlyn West is a 41 y.o. female who presents to the clinic status post novasure uterine ablation on 05/21/22. The patient is not having any pain.  Eating a regular diet without difficulty. Bowel movements are normal. No other significant postoperative concerns.Pt does note 2-3 day hx of urinary frequency and incomplete emptying, no dysuria.  Will check UA.  The following portions of the patient's history were reviewed and updated as appropriate: allergies, current medications, past family history, past medical history, past social history, past surgical history, and problem list..  Last pap smear was normal on 02/25/19.  Review of Systems Pertinent items are noted in HPI.   Objective:   Ht '5\' 2"'$  (1.575 m)   Wt 133 lb 14.4 oz (60.7 kg)   BMI 24.49 kg/m  Constitutional:  Well-developed, well-nourished female in no acute distress.   Skin: Skin is warm and dry, no rash noted, not diaphoretic,no erythema, no pallor.  Cardiovascular: Normal heart rate noted  Respiratory: Effort and breath sounds normal, no problems with respiration noted  Abdomen: Soft, bowel sounds active, non-tender, no abnormal masses  Incision: Healing well, no drainage, no erythema, no hernia, no seroma, no swelling, no dehiscence, incision well approximated  Pelvic:    Normal vaginal. Cervix WNL, pap taken without incident, chaperone present   Surgical pathology () N/a Assessment:   Doing well postoperatively.  Operative findings again reviewed. Pathology report discussed.   Plan:   1. Continue any current medications. 2. Wound care discussed. 3. Activity restrictions: none 4. Anticipated return to work: now. 5. Follow up as needed 6.  Routine preventative health maintenance measures emphasized. Please refer to After Visit Summary for other counseling recommendations.  UA questionable for UTI, will send urine culture and empirically treat.  Lynnda Shields, MD, [place] Attending [facility], [place]

## 2022-06-25 LAB — CYTOLOGY - PAP
Comment: NEGATIVE
Diagnosis: NEGATIVE
High risk HPV: NEGATIVE

## 2022-06-25 LAB — URINE CULTURE

## 2022-07-17 ENCOUNTER — Ambulatory Visit: Payer: Medicaid Other | Admitting: Family Medicine

## 2022-07-17 VITALS — BP 122/80 | HR 102 | Temp 98.7°F | Ht 62.0 in | Wt 129.8 lb

## 2022-07-17 DIAGNOSIS — R300 Dysuria: Secondary | ICD-10-CM

## 2022-07-17 DIAGNOSIS — R0781 Pleurodynia: Secondary | ICD-10-CM | POA: Diagnosis present

## 2022-07-17 DIAGNOSIS — R109 Unspecified abdominal pain: Secondary | ICD-10-CM | POA: Diagnosis not present

## 2022-07-17 DIAGNOSIS — Z23 Encounter for immunization: Secondary | ICD-10-CM | POA: Diagnosis not present

## 2022-07-17 MED ORDER — NITROFURANTOIN MONOHYD MACRO 100 MG PO CAPS: 100.0000 mg | ORAL_CAPSULE | Freq: Two times a day (BID) | ORAL | 0 refills | Status: DC

## 2022-07-17 MED ORDER — CYCLOBENZAPRINE HCL 5 MG PO TABS: 5.0000 mg | ORAL_TABLET | Freq: Two times a day (BID) | ORAL | 0 refills | Status: DC | PRN

## 2022-07-17 NOTE — Progress Notes (Signed)
  Date of Visit: 07/17/2022   SUBJECTIVE:   HPI:  Kaitlyn West presents today for a same day appointment to discuss several issues.  Urine: Was treated for urinary tract infection about a month ago at her OB/GYN office with Keflex.  At that time her urine grew greater than 100,000 colonies of E. coli that was sensitive to everything except ampicillin.  She reports her dysuria improved after taking Keflex, but returned about a week ago.  She wonders if the urine was not fully treated by the Keflex.  She denies any vaginal itching or burning.  No abnormal discharge, other than what is to be expected after her recent endometrial ablation.  Knots on rib cage: Noticed over the weekend that she was having some discomfort and knot type feelings on the lower aspect of her rib cage anteriorly bilaterally, especially on the right.  Denies any preceding injury.  Has not tried anything to make this feel better.  She was not sure what it was so did not try anything to make it better.  Left-sided back pain: Has had this for months.  She wondered if it was her kidney causing discomfort.  This well preceded any urinary symptoms.  It is worse when she turns or twists.  Unintentional weight loss: Noted she has lost about 25 pounds in the last 6 months.  She has not been trying to lose weight.  She is agreeable to return to discuss this in more detail. Denies fevers.  OBJECTIVE:   BP 122/80   Pulse (!) 102   Temp 98.7 F (37.1 C)   Wt 129 lb 12.8 oz (58.9 kg)   SpO2 99%   BMI 23.74 kg/m  Gen: No acute distress, pleasant, cooperative, well-appearing Lungs: Normal respiratory effort Chest wall: Mild normal mobility of lower floating ribs bilaterally without any erythema, or swelling.  Mildly tender to palpation. Back: Pain in left flank with twisting motion of the torso to the right.  Tender with deep palpation.  ASSESSMENT/PLAN:   Health maintenance:  -Flu shot given today  Left flank pain Suspect  musculoskeletal in nature given worsening with twisting motion of torso.  Trial of Flexeril and ibuprofen.  Follow-up if not improving.  UTI Symptoms incompletely resolved with course of Keflex.  We will retreat with Macrobid.  Follow-up if not improving.  Unable to send additional urine testing today due to lab closure. If symptoms persist would recommend pelvic exam with vaginal swabs and repeat urine testing.  Rib cage knots Suspect this is pulled muscle or muscle spasm.  No significant abnormalities on exam.  Again, trial of ibuprofen and Flexeril.  Counseled on risk of sedation with Flexeril.  Unintentional weight loss Unable to address this in great detail today due to the number of other issues we discussed.  Scheduled her to come back and see her PCP next Thursday to discuss in more detail.  FOLLOW UP: Follow up in 8 days for weight loss with PCP  [place] J. Ardelia Mems, [place]

## 2022-07-17 NOTE — Patient Instructions (Signed)
It was nice to meet you today!  For your urine I sent in a new antibiotic for you to take twice a day for 5 days.  Please follow-up if that is not getting better.  The knots in your rib cage may just be some muscle spasm.  I sent in Flexeril which is a muscle relaxer for you to try.  You can also take ibuprofen.  Please follow-up with Dr. Owens Shark next Thursday as scheduled to discuss your weight loss in more detail.  Be well, Dr. Ardelia Mems

## 2022-07-25 ENCOUNTER — Ambulatory Visit: Payer: Self-pay | Admitting: Student

## 2022-09-30 IMAGING — US US PELVIS COMPLETE WITH TRANSVAGINAL
1 series · 15 of 25 positions shown · non-contrast
Comparison: None

CLINICAL DATA: Inter cycle bleeding



[Series 1: us pelvis complete with transvaginal · 15 of 106 slices shown]
[im 1/106]
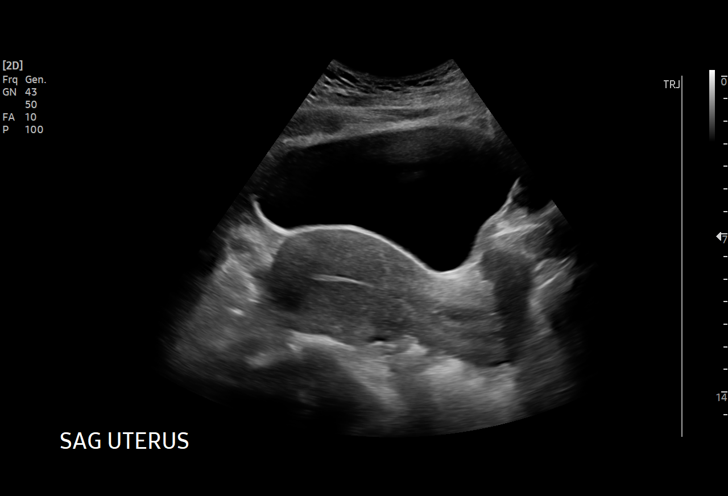
[im 9/106]
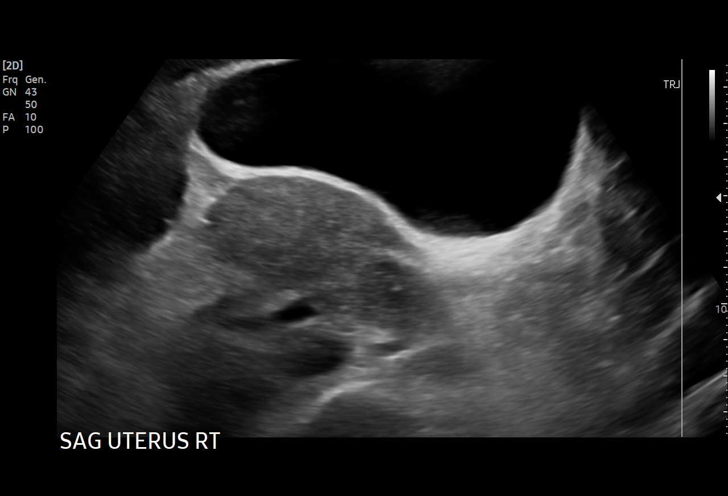
[im 18/106]
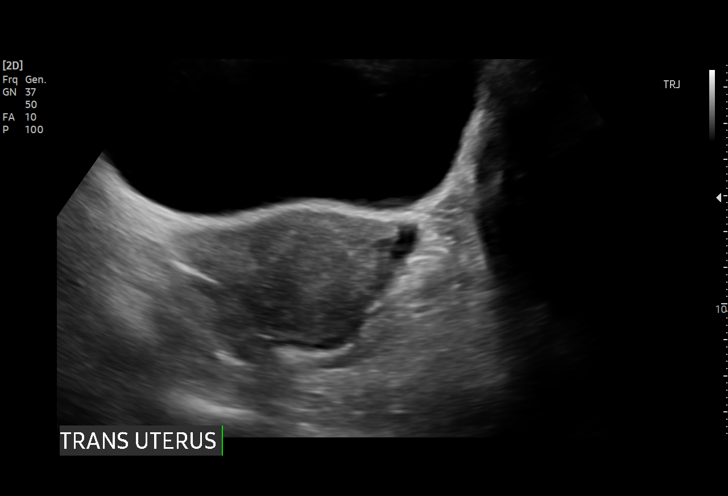
[im 22/106]
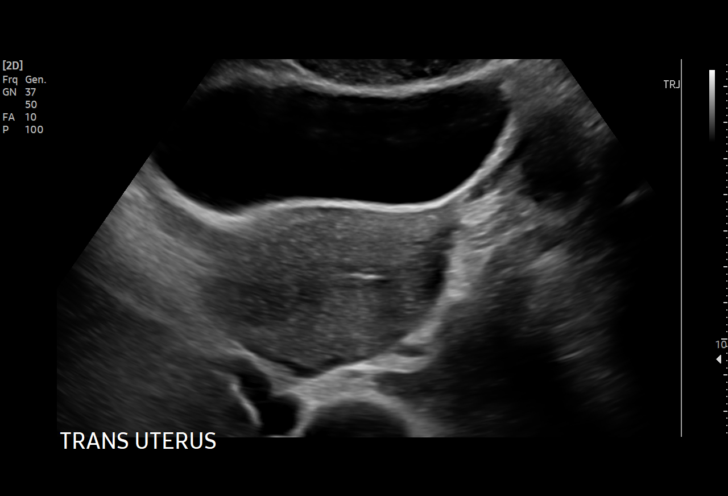
[im 31/106]
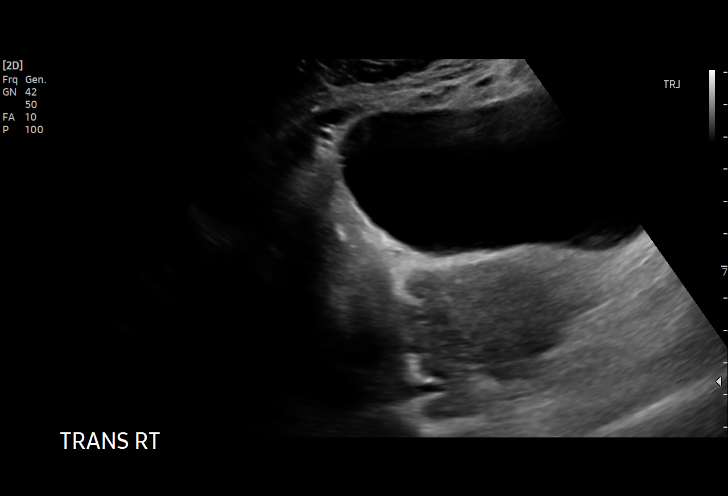
[im 40/106]
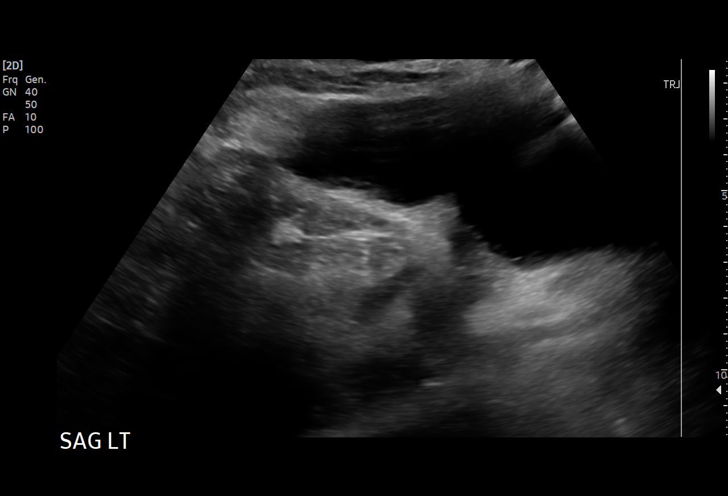
[im 44/106]
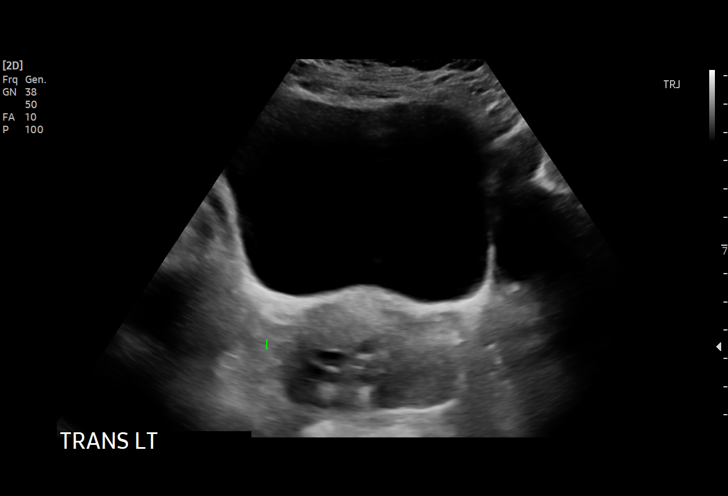
[im 53/106]
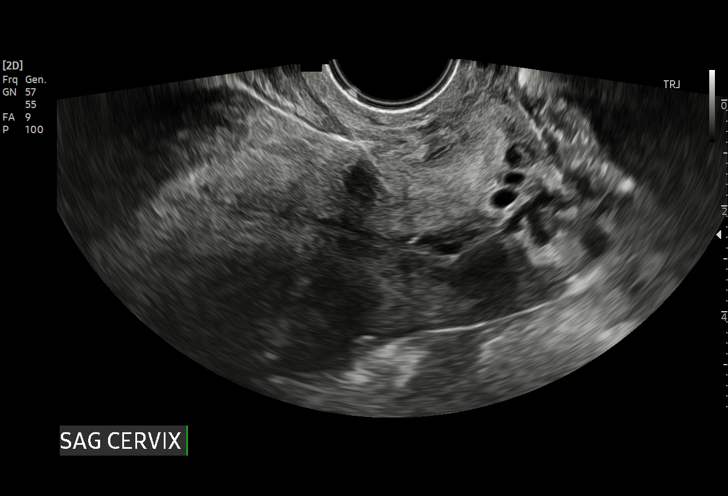
[im 62/106]
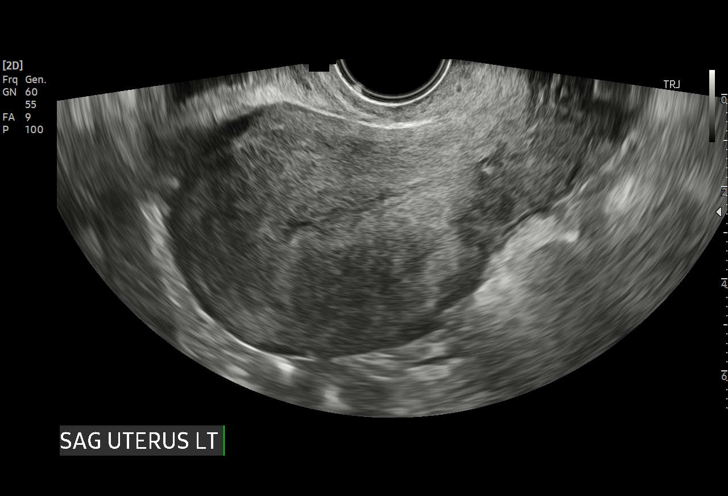
[im 66/106]
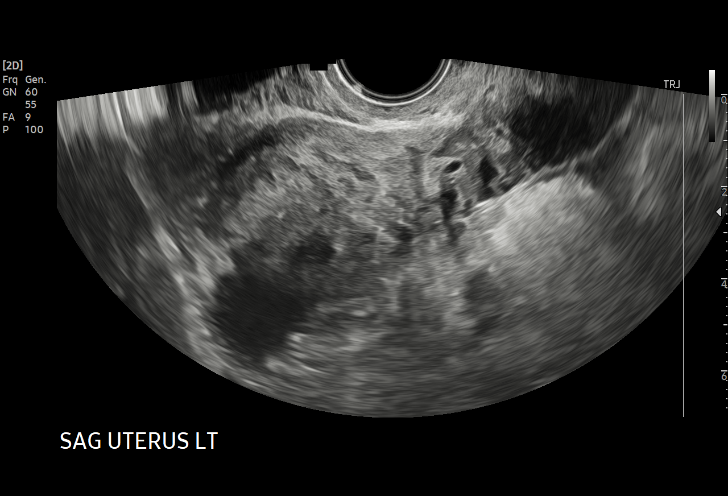
[im 75/106]
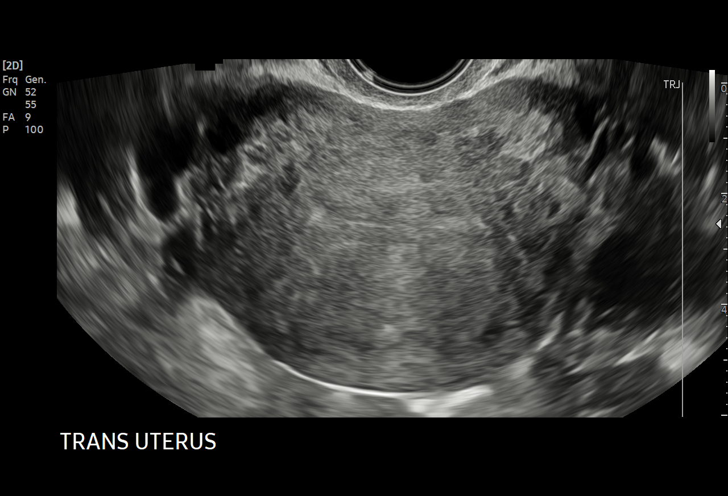
[im 84/106]
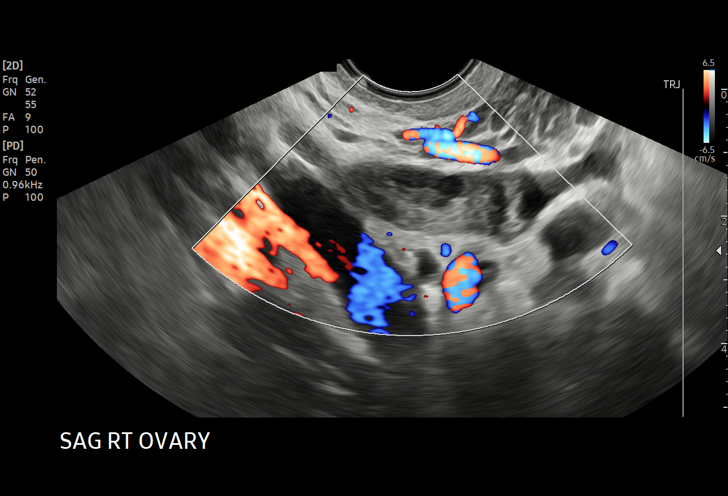
[im 88/106]
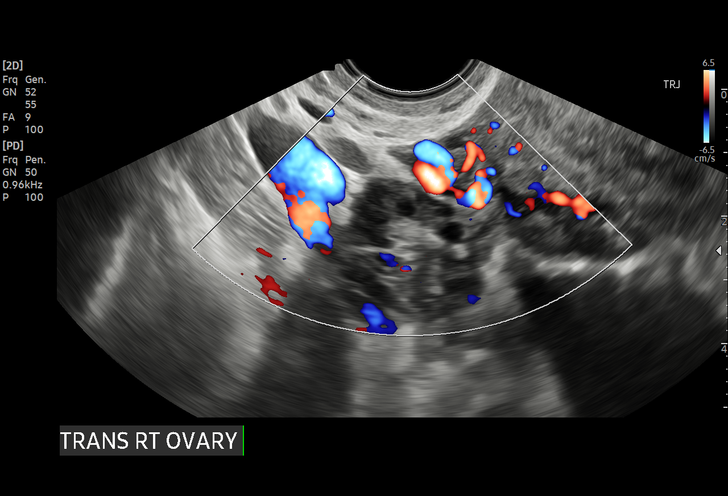
[im 97/106]
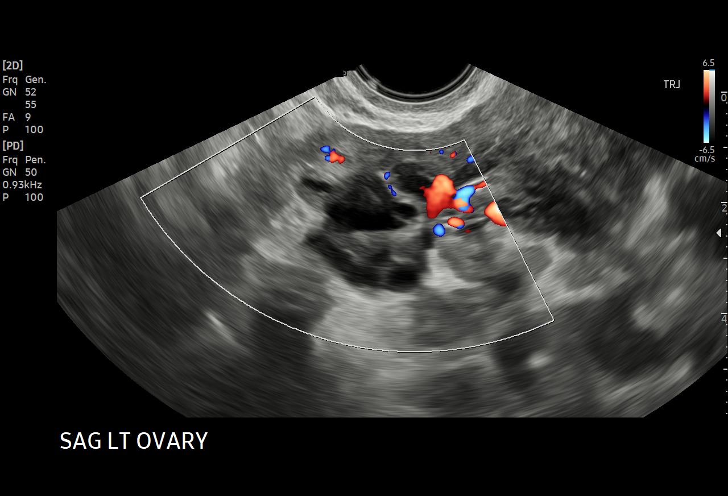
[im 106/106]
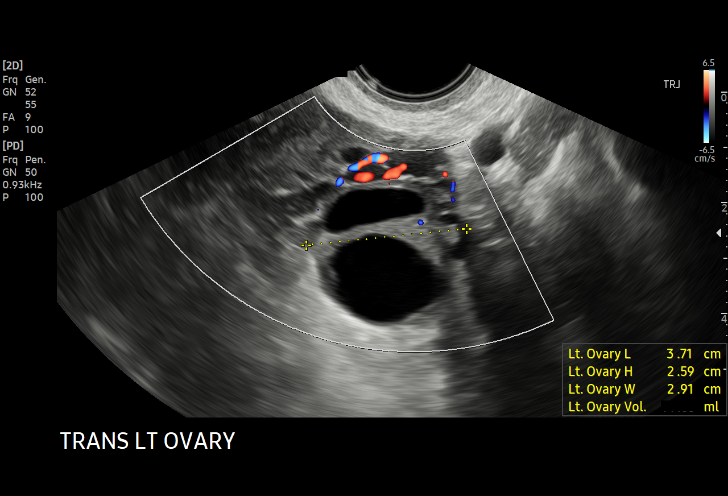

[15 of 25 positions shown; findings below may reference images not displayed]

FINDINGS: Uterus

Measurements: 11.3 x 4.8 x 7.2 cm = volume: 205 mL. No fibroids or
other mass visualized.

Endometrium

Thickness: 7 mm.  No focal abnormality visualized.

Right ovary

Measurements: 3.0 x 1.7 x 1.6 cm = volume: 4.4 mL. Normal
appearance/no adnexal mass.

Left ovary

Measurements: 3.7 x 2.6 x 2.9 cm = volume: 14.6 mL. Normal
appearance/no adnexal mass.

Other findings

No abnormal free fluid.
IMPRESSION: Negative pelvic ultrasound.

## 2023-01-29 ENCOUNTER — Other Ambulatory Visit: Payer: Self-pay | Admitting: Family Medicine

## 2023-01-29 DIAGNOSIS — Z1231 Encounter for screening mammogram for malignant neoplasm of breast: Secondary | ICD-10-CM

## 2023-02-19 ENCOUNTER — Encounter: Payer: Self-pay | Admitting: Emergency Medicine

## 2023-02-19 ENCOUNTER — Ambulatory Visit (INDEPENDENT_AMBULATORY_CARE_PROVIDER_SITE_OTHER): Payer: Medicaid Other

## 2023-02-19 ENCOUNTER — Ambulatory Visit
Admission: EM | Admit: 2023-02-19 | Discharge: 2023-02-19 | Disposition: A | Discharge status: Home / Self Care | Payer: Medicaid Other | Attending: Internal Medicine | Admitting: Internal Medicine

## 2023-02-19 DIAGNOSIS — R109 Unspecified abdominal pain: Secondary | ICD-10-CM

## 2023-02-19 DIAGNOSIS — R310 Gross hematuria: Secondary | ICD-10-CM | POA: Insufficient documentation

## 2023-02-19 DIAGNOSIS — R300 Dysuria: Secondary | ICD-10-CM | POA: Insufficient documentation

## 2023-02-19 LAB — POCT URINALYSIS DIP (MANUAL ENTRY)
Bilirubin, UA: NEGATIVE
Glucose, UA: NEGATIVE mg/dL
Ketones, POC UA: NEGATIVE mg/dL
Leukocytes, UA: NEGATIVE
Nitrite, UA: NEGATIVE
Protein Ur, POC: NEGATIVE mg/dL
Spec Grav, UA: 1.025 (ref 1.010–1.025)
Urobilinogen, UA: 1 E.U./dL
pH, UA: 6.5 (ref 5.0–8.0)

## 2023-02-19 MED ORDER — NITROFURANTOIN MONOHYD MACRO 100 MG PO CAPS: 100.0000 mg | ORAL_CAPSULE | Freq: Two times a day (BID) | ORAL | 0 refills | Status: DC

## 2023-02-19 NOTE — ED Provider Notes (Signed)
[facility]    CSN: [id number] Arrival date & time: 02/19/23  1543      History   Chief Complaint Chief Complaint  Patient presents with   Dysuria   Abdominal Pain    HPI Kaitlyn West is a 42 y.o. female.   Patient presents with lower abdominal pain, dysuria, hematuria that started about 4 to 5 days ago.  Reports that she has had UTIs in the past and this feels similar.  Denies vaginal discharge, fever, chills, nausea, vomiting.  Patient does report some left flank/back pain that started a year ago so she is not sure if this is related.  Reports that she did take a few pills of clindamycin that she had leftover from a previous infection at home to see if those would help alleviate symptoms.  Reports that symptoms improved but then returned.  Denies concern for pregnancy as she has had an ablation.  Denies concern for STD or any exposure to STD.   Dysuria Abdominal Pain   Past Medical History:  Diagnosis Date   Acne 01/22/2011   Anemia    Headache    Migraine 04/01/2013   Reflux     Patient Active Problem List   Diagnosis Date Noted   Urinary frequency 06/21/2022   Encounter for removal of intrauterine contraceptive device (IUD) 02/28/2021   Subclinical hyperthyroidism 01/02/2021   Intermenstrual bleeding 12/08/2020   Decreased thyroid stimulating hormone (TSH) level 06/07/2019   Family hx of ovarian malignancy 02/26/2019   Family history of G6PD 02/26/2019   IDA (iron deficiency anemia) 10/07/2008   SMOKER 10/07/2008   Menorrhagia with regular cycle 10/07/2008    Past Surgical History:  Procedure Laterality Date   CHOLECYSTECTOMY     ENDOMETRIAL ABLATION N/A 05/21/2022   Procedure: NOVASURE ENDOMETRIAL ABLATION;  Surgeon: Adam Phenix, MD;  Location: [facility] OR;  Service: Gynecology;  Laterality: N/A;   TUBAL LIGATION      OB History   No obstetric history on file.      Home Medications    Prior to Admission medications   Medication Sig  Start Date End Date Taking? Authorizing Provider  nitrofurantoin, macrocrystal-monohydrate, (MACROBID) 100 MG capsule Take 1 capsule (100 mg total) by mouth 2 (two) times daily. 02/19/23  Yes Brynnlee Cumpian, Acie Fredrickson, FNP  cetirizine (ZYRTEC) 10 MG tablet Take 1 tablet (10 mg total) by mouth daily. 01/02/22   Dameron, Nolberto Hanlon, DO  cyclobenzaprine (FLEXERIL) 5 MG tablet Take 1 tablet (5 mg total) by mouth 2 (two) times daily as needed for muscle spasms. 07/17/22   Latrelle Dodrill, MD    Family History Family History  Problem Relation Age of Onset   Migraines Mother    Diabetes Mellitus II Mother    Diabetes Mellitus II Father    Migraines Brother    Breast cancer Maternal Grandmother    Migraines Daughter    Colon cancer Neg Hx    Stomach cancer Neg Hx    Rectal cancer Neg Hx    Esophageal cancer Neg Hx     Social History Social History   Tobacco Use   Smoking status: Every Day    Packs/day: .5    Types: Cigarettes   Smokeless tobacco: Never  Vaping Use   Vaping Use: Never used  Substance Use Topics   Alcohol use: No   Drug use: No     Allergies   Pistachio nut (diagnostic) and Shellfish allergy   Review of Systems Review of Systems  Per HPI  Physical Exam Triage Vital Signs ED Triage Vitals  Enc Vitals Group     BP 02/19/23 1715 115/72     Pulse Rate 02/19/23 1715 84     Resp 02/19/23 1715 20     Temp 02/19/23 1715 98.4 F (36.9 C)     Temp Source 02/19/23 1715 Oral     SpO2 02/19/23 1715 97 %     Weight --      Height --      Head Circumference --      Peak Flow --      Pain Score 02/19/23 1724 2     Pain Loc --      Pain Edu? --      Excl. in GC? --    No data found.  Updated Vital Signs BP 115/72 (BP Location: Right Arm)   Pulse 84   Temp 98.4 F (36.9 C) (Oral)   Resp 20   SpO2 97%   Visual Acuity Right Eye Distance:   Left Eye Distance:   Bilateral Distance:    Right Eye Near:   Left Eye Near:    Bilateral Near:     Physical  Exam Constitutional:      General: She is not in acute distress.    Appearance: Normal appearance. She is not toxic-appearing or diaphoretic.  HENT:     Head: Normocephalic and atraumatic.  Eyes:     Extraocular Movements: Extraocular movements intact.     Conjunctiva/sclera: Conjunctivae normal.  Cardiovascular:     Rate and Rhythm: Normal rate and regular rhythm.     Pulses: Normal pulses.     Heart sounds: Normal heart sounds.  Pulmonary:     Effort: Pulmonary effort is normal. No respiratory distress.     Breath sounds: Normal breath sounds.  Abdominal:     General: Bowel sounds are normal. There is no distension.     Palpations: Abdomen is soft.     Tenderness: There is no abdominal tenderness.  Genitourinary:    Comments: Deferred with shared decision making.  Self swab performed. Musculoskeletal:     Comments: No obvious flank pain or tenderness to palpation to back.  Neurological:     General: No focal deficit present.     Mental Status: She is alert and oriented to person, place, and time. Mental status is at baseline.  Psychiatric:        Mood and Affect: Mood normal.        Behavior: Behavior normal.        Thought Content: Thought content normal.        Judgment: Judgment normal.      UC Treatments / Results  Labs (all labs ordered are listed, but only abnormal results are displayed) Labs Reviewed  POCT URINALYSIS DIP (MANUAL ENTRY) - Abnormal; Notable for the following components:      Result Value   Blood, UA moderate (*)    All other components within normal limits  URINE CULTURE  CERVICOVAGINAL ANCILLARY ONLY    EKG   Radiology DG Abdomen 1 View  Result Date: 02/19/2023 CLINICAL DATA:  Dysuria flank pain EXAM: ABDOMEN - 1 VIEW COMPARISON:  Lumbar radiographs 05/04/2004 FINDINGS: The bowel gas pattern is normal. No radio-opaque calculi or other significant radiographic abnormality are seen. Clips in the right upper quadrant. Probable ligamentous  calcification in the right pelvis. Stable round smooth calcification left pelvis consistent with phleboliths. IMPRESSION: Negative. Electronically Signed   By: Selena Batten  Jake Samples M.D.   On: 02/19/2023 18:28    Procedures Procedures (including critical care time)  Medications Ordered in UC Medications - No data to display  Initial Impression / Assessment and Plan / UC Course  I have reviewed the triage vital signs and the nursing notes.  Pertinent labs & imaging results that were available during my care of the patient were reviewed by me and considered in my medical decision making (see chart for details).     UA not showing any signs of UTI or infection.  It does show hematuria so KUB was performed with no obvious signs of kidney stone or abnormality.  Although, I am still suspicious of UTI given patient's symptoms and stating that the symptoms feel similar to previous UTIs.  Therefore, will opt to treat with Macrobid and send urine culture off.  Another etiology could be vaginitis especially given that clindamycin helped improve symptoms.  Therefore, cervicovaginal swab is pending.  Awaiting results.  Patient was open to STD testing with vaginal swab as well.  Advised strict return precautions if any symptoms persist or worsen.  Patient verbalized understanding and was agreeable with plan. Final Clinical Impressions(s) / UC Diagnoses   Final diagnoses:  Dysuria  Gross hematuria  Flank pain     Discharge Instructions      Your x-ray was normal.  I am treating you with Macrobid for urinary tract infection.  Urine culture and vaginal swab are pending.  We will call if they are abnormal and treat if necessary.  Please follow-up if any symptoms persist or worsen.     ED Prescriptions     Medication Sig Dispense Auth. Provider   nitrofurantoin, macrocrystal-monohydrate, (MACROBID) 100 MG capsule Take 1 capsule (100 mg total) by mouth 2 (two) times daily. 10 capsule Gustavus Bryant, [place]       PDMP not reviewed this encounter.   Gustavus Bryant, [place] 02/19/23 [phone]

## 2023-02-19 NOTE — Discharge Instructions (Signed)
Your x-ray was normal.  I am treating you with Macrobid for urinary tract infection.  Urine culture and vaginal swab are pending.  We will call if they are abnormal and treat if necessary.  Please follow-up if any symptoms persist or worsen.

## 2023-02-19 NOTE — ED Triage Notes (Signed)
Dysuria, abdominal pain, hematuria starting Saturday. Took antibiotics she had at home (clindamycin), felt better, symptoms returned yesterday.

## 2023-02-20 LAB — CERVICOVAGINAL ANCILLARY ONLY
Bacterial Vaginitis (gardnerella): NEGATIVE
Candida Glabrata: NEGATIVE
Candida Vaginitis: NEGATIVE
Chlamydia: NEGATIVE
Comment: NEGATIVE
Comment: NEGATIVE
Comment: NEGATIVE
Comment: NEGATIVE
Comment: NEGATIVE
Comment: NORMAL
Neisseria Gonorrhea: NEGATIVE
Trichomonas: NEGATIVE

## 2023-02-21 LAB — URINE CULTURE: Culture: 50000 — AB

## 2023-02-22 LAB — URINE CULTURE

## 2023-03-21 ENCOUNTER — Ambulatory Visit: Payer: Medicaid Other

## 2023-03-25 ENCOUNTER — Ambulatory Visit
Admission: RE | Admit: 2023-03-25 | Discharge: 2023-03-25 | Disposition: A | Discharge status: Home / Self Care | Payer: Medicaid Other | Source: Ambulatory Visit | Attending: Family Medicine | Admitting: Family Medicine

## 2023-03-25 DIAGNOSIS — Z1231 Encounter for screening mammogram for malignant neoplasm of breast: Secondary | ICD-10-CM

## 2023-04-21 ENCOUNTER — Ambulatory Visit: Payer: Medicaid Other | Admitting: Student

## 2023-04-21 ENCOUNTER — Encounter: Payer: Self-pay | Admitting: Student

## 2023-04-21 VITALS — BP 110/62 | HR 79 | Ht 62.0 in | Wt 131.4 lb

## 2023-04-21 DIAGNOSIS — N951 Menopausal and female climacteric states: Secondary | ICD-10-CM | POA: Diagnosis not present

## 2023-04-21 DIAGNOSIS — K602 Anal fissure, unspecified: Secondary | ICD-10-CM | POA: Insufficient documentation

## 2023-04-21 MED ORDER — PREMARIN 0.625 MG/GM VA CREA: 1.0000 | TOPICAL_CREAM | Freq: Every day | VAGINAL | 12 refills | Status: AC

## 2023-04-21 MED ORDER — NITROGLYCERIN 0.4 % RE OINT: 1.0000 | TOPICAL_OINTMENT | Freq: Every day | RECTAL | 0 refills | Status: DC

## 2023-04-21 NOTE — Patient Instructions (Addendum)
It was great seeing you today.  As we discussed, -Make sure to use plenty of water-based lubricant prior to sexual intercourse -Please apply topical nitroglycerin to your anal fissure daily.  Return in 1 months we can see how you are doing -I prescribed vaginal estrogen cream that should help with your vaginal dryness.  Please return in 1 month for follow-up.  At that time, if you are hot flashes are extraordinarily bothersome, we can discuss other options to help with your symptoms.   If you have any questions or concerns, please feel free to call the clinic.   Have a wonderful day,  Dr. Darral Dash [facility] [phone]

## 2023-04-21 NOTE — Assessment & Plan Note (Signed)
About 1 cm in size Reassuringly, no signs of abscess or other infectious signs on exam Prescribed topical nitroglycerin to use daily Discussed bowel regimen ensuring soft, regular bowel movements.  MiraLAX if needed. Follow-up in 1 month to ensure healing

## 2023-04-21 NOTE — Assessment & Plan Note (Signed)
S/p endometrial ablation. Encouraged to use plenty of water-based lubricant with sexual intercourse Also prescribed vaginal estrogen cream to help with vaginal dryness, also to help prevent UTIs given that she is now menopausal state

## 2023-04-21 NOTE — Progress Notes (Signed)
    SUBJECTIVE:   CHIEF COMPLAINT / HPI:   Kaitlyn West is a pleasant 42 year old female here to discuss vaginal dryness and hair near her rectum.  She says that she noticed the tear a few few months ago, noticed just not getting better.  Denies any drainage, or bleeding from the area. Does have bowel movements regularly, but not daily.  This is normal for her. She does not have much straining with bowel movements. Has not been using any topical medications on the area.  She had an endometrial ablation about a year ago.  Since then, she has had menopausal symptoms with hot flashes and vaginal dryness. Dryness is mostly with intercourse.  Does not use lubricant.   PERTINENT  PMH / PSH: History of menorrhagia s/p NovaSure endometrial ablation in 2023, history of tobacco use   OBJECTIVE:   BP 110/62   Pulse 79   Ht 5\' 2"  (1.575 m)   Wt 131 lb 6.4 oz (59.6 kg)   SpO2 99%   BMI 24.03 kg/m   General: Pleasant, well-appearing, no distress Respiratory: Normal work of breathing on room air GU: Dahlia Client, CMA present as chaperone during exam.  1 cm, anal fissure at the 8 o'clock position with no purulence or drainage appreciated   ASSESSMENT/PLAN:   Anal fissure About 1 cm in size Reassuringly, no signs of abscess or other infectious signs on exam Prescribed topical nitroglycerin to use daily Discussed bowel regimen ensuring soft, regular bowel movements.  MiraLAX if needed. Follow-up in 1 month to ensure healing  Vaginal dryness, menopausal S/p endometrial ablation. Encouraged to use plenty of water-based lubricant with sexual intercourse Also prescribed vaginal estrogen cream to help with vaginal dryness, also to help prevent UTIs given that she is now menopausal state     Darral Dash, DO [facility]

## 2023-05-20 ENCOUNTER — Ambulatory Visit: Payer: Medicaid Other | Admitting: Student

## 2023-08-27 ENCOUNTER — Encounter: Payer: Self-pay | Admitting: Student

## 2023-08-27 ENCOUNTER — Ambulatory Visit (INDEPENDENT_AMBULATORY_CARE_PROVIDER_SITE_OTHER): Payer: BC Managed Care – PPO | Admitting: Student

## 2023-08-27 VITALS — BP 112/72 | HR 91 | Ht 61.0 in | Wt 133.4 lb

## 2023-08-27 DIAGNOSIS — N951 Menopausal and female climacteric states: Secondary | ICD-10-CM | POA: Diagnosis not present

## 2023-08-27 DIAGNOSIS — R6882 Decreased libido: Secondary | ICD-10-CM | POA: Insufficient documentation

## 2023-08-27 NOTE — Assessment & Plan Note (Signed)
Likely secondary to menopause with hormone fluctuations.  See plan above. No signs or symptoms of depression/anxiety Does not take any medications that would decrease her libido No signs or symptoms of chronic illness to suggest etiology

## 2023-08-27 NOTE — Progress Notes (Signed)
    SUBJECTIVE:   CHIEF COMPLAINT / HPI:   Kaitlyn West is a 42 year-old female here to discuss change in libido.  I last saw her in July of this year and she was having menopausal symptoms with hot flashes, vaginal dryness s/p endometrial ablation.  -Amenorrheic for 1 year. She is having hot flashes at night mostly. -Is still having vaginal dryness with intercourse, but not as bad, has enough estrogen cream.  - Recently changed jobs and is now working for [employer] which she is really enjoying.  She looks forward to work. -Single, but has long-term boyfriend of 3 years.  Boyfriend says that he wants her to be able to get into the mood, and initiate intimacy herself.  They are sexually active 5 out of 7 days of the week. -Denies pain with intercourse, but sometimes does have pain on initiation.  Does not use any lubrication.  PERTINENT  PMH / PSH:   OBJECTIVE:   BP 112/72   Pulse 91   Ht 5\' 1"  (1.549 m)   Wt 133 lb 6.4 oz (60.5 kg)   SpO2 100%   BMI 25.21 kg/m   General: Pleasant, well-appearing, in good spirits Respiratory: Normal work of breathing on room air.  Speaks in full sentences Psych: Normal mood and affect.  Makes good eye contact.  ASSESSMENT/PLAN:   Menopause syndrome Meets criteria for menopausal syndrome, now that she has been amenorrheic for 1 year. Discussed mitigating sequelae of symptoms from menopause to help improve her libido including therapy (consider couples therapy), open communication with partner, using water-based lubrication and vaginal estrogen cream for vaginal dryness. Patient was satisfied with this plan.  Decreased libido Likely secondary to menopause with hormone fluctuations.  See plan above. No signs or symptoms of depression/anxiety Does not take any medications that would decrease her libido No signs or symptoms of chronic illness to suggest etiology     Darral Dash, DO [facility]

## 2023-08-27 NOTE — Assessment & Plan Note (Signed)
Meets criteria for menopausal syndrome, now that she has been amenorrheic for 1 year. Discussed mitigating sequelae of symptoms from menopause to help improve her libido including therapy (consider couples therapy), open communication with partner, using water-based lubrication and vaginal estrogen cream for vaginal dryness. Patient was satisfied with this plan.

## 2023-10-14 ENCOUNTER — Telehealth: Payer: Self-pay

## 2023-10-14 MED ORDER — FLUCONAZOLE 150 MG PO TABS: 150.0000 mg | ORAL_TABLET | Freq: Once | ORAL | 0 refills | Status: AC

## 2023-10-14 NOTE — Telephone Encounter (Signed)
 Patient calls nurse line requesting a round of diflucan .   She reports symptoms started ~4 days ago. She reports itching and burning. She reports thick white discharge. Denies fishy odor.   She reports she used OTC monistat with minimal improvement.   She reports she works in [employer] and does not get off until 330p each day. We were unable to find an apt time that will align this week.   She is requesting medication be sent to [facility] pharmacy.   Advised will forward to PCP.

## 2023-12-30 ENCOUNTER — Other Ambulatory Visit: Payer: Self-pay

## 2023-12-31 MED ORDER — CETIRIZINE HCL 10 MG PO TABS: 10.0000 mg | ORAL_TABLET | Freq: Every day | ORAL | 1 refills | Status: DC

## 2024-03-04 ENCOUNTER — Other Ambulatory Visit: Payer: Self-pay | Admitting: Family Medicine

## 2024-03-04 DIAGNOSIS — Z1231 Encounter for screening mammogram for malignant neoplasm of breast: Secondary | ICD-10-CM

## 2024-03-05 ENCOUNTER — Ambulatory Visit: Payer: Self-pay | Admitting: Family Medicine

## 2024-03-05 ENCOUNTER — Encounter: Payer: Self-pay | Admitting: Family Medicine

## 2024-03-05 ENCOUNTER — Ambulatory Visit: Admitting: Family Medicine

## 2024-03-05 ENCOUNTER — Other Ambulatory Visit (HOSPITAL_COMMUNITY)
Admission: RE | Admit: 2024-03-05 | Discharge: 2024-03-05 | Disposition: A | Discharge status: Home / Self Care | Source: Ambulatory Visit | Attending: Family Medicine | Admitting: Family Medicine

## 2024-03-05 ENCOUNTER — Other Ambulatory Visit: Payer: Self-pay | Admitting: Family Medicine

## 2024-03-05 VITALS — BP 105/67 | HR 85 | Ht 61.0 in | Wt 123.5 lb

## 2024-03-05 DIAGNOSIS — N939 Abnormal uterine and vaginal bleeding, unspecified: Secondary | ICD-10-CM | POA: Diagnosis not present

## 2024-03-05 DIAGNOSIS — N760 Acute vaginitis: Secondary | ICD-10-CM

## 2024-03-05 LAB — POCT URINALYSIS DIP (MANUAL ENTRY)
Bilirubin, UA: NEGATIVE
Glucose, UA: NEGATIVE mg/dL
Ketones, POC UA: NEGATIVE mg/dL
Leukocytes, UA: NEGATIVE
Nitrite, UA: NEGATIVE
Protein Ur, POC: NEGATIVE mg/dL
Spec Grav, UA: 1.015 (ref 1.010–1.025)
Urobilinogen, UA: 0.2 U/dL
pH, UA: 6 (ref 5.0–8.0)

## 2024-03-05 LAB — POCT WET PREP (WET MOUNT)
Clue Cells Wet Prep Whiff POC: POSITIVE
Trichomonas Wet Prep HPF POC: ABSENT

## 2024-03-05 LAB — POCT UA - MICROSCOPIC ONLY
Bacteria, U Microscopic: NONE SEEN
WBC, Ur, HPF, POC: NONE SEEN (ref 0–5)

## 2024-03-05 LAB — POCT URINE PREGNANCY: Preg Test, Ur: NEGATIVE

## 2024-03-05 LAB — POCT HEMOGLOBIN: Hemoglobin: 13.3 g/dL (ref 11–14.6)

## 2024-03-05 MED ORDER — CETIRIZINE HCL 10 MG PO TABS: 10.0000 mg | ORAL_TABLET | Freq: Every day | ORAL | 3 refills | Status: DC

## 2024-03-05 MED ORDER — METRONIDAZOLE 500 MG PO TABS
500.0000 mg | ORAL_TABLET | Freq: Two times a day (BID) | ORAL | 0 refills | Status: DC
Start: 2024-03-05 — End: 2024-07-15

## 2024-03-05 NOTE — Progress Notes (Signed)
 Called pt she prefers oral treatment .FlaylBID x 7 days sent to pharmacy.

## 2024-03-05 NOTE — Patient Instructions (Signed)
 It was wonderful to see you today.  Please bring ALL of your medications with you to every visit.   Today we talked about:  - We tested your blood and urine for infection, and we repeated your pap smear, I will call you with results!  Thank you for choosing [facility].   Please call [phone] with any questions about today's appointment.  Please arrive at least 15 minutes prior to your scheduled appointments.   If you had blood work today, I will send you a MyChart message or a letter if results are normal. Otherwise, I will give you a call.   If you had a referral placed, they will call you to set up an appointment. Please give us  a call if you don't hear back in the next 2 weeks.   If you need additional refills before your next appointment, please call your pharmacy first.   Avanell Bob, MD  Family Medicine

## 2024-03-05 NOTE — Progress Notes (Signed)
    SUBJECTIVE:   CHIEF COMPLAINT / HPI:   Vaginal bleeding- had uterine ablation in August 2023 with a normal endometrial biopsy . Last pelvic US  in 2022 without fibroids. Started having bloody discharge when she wipes Feb 2025. Sometimes notices bloody mucous in her urine, denies flank pain, fevers, gross hematuria, dysuria. Last sexually active 5 months ago. Now had several episodes of the blood when she wipes, no clots or large amount of bleeding. Was wondering if it is after her endometrial ablation. No vaginal discharge, no itching or odor. No blood in stool. No abdominal pain. No pain with intercourse, although has not had intercourse for 5 months. Has not been using her vaginal estrogen. Denies night sweats.   PERTINENT  PMH / PSH:   OBJECTIVE:   BP 105/67   Pulse 85   Ht 5\' 1"  (1.549 m)   Wt 123 lb 8 oz (56 kg)   SpO2 100%   BMI 23.34 kg/m   General: alert & oriented, no apparent distress, well groomed HEENT: normocephalic, atraumatic, EOM grossly intact, oral mucosa moist, neck supple Respiratory: normal respiratory effort GI: non-distended Skin: no rashes, no jaundice Psych: appropriate mood and affect   GU: Normal appearance of labia majora and minora, without lesions. Vagina tissue pink, moist, without lesions or abrasions. Cervix normal appearance, non-friable, with brown/red mucous coming from os.   Kaitlyn West CMA present as chaperone for pelvic exam.    ASSESSMENT/PLAN:   Assessment & Plan Vaginal bleeding POC Hgb today 13.3. POC wet prep + BV, will send flagyl  to pharmacy  Pt requested repeat pap although she is not due for two more years, reasonable to repeat in setting of new bleeding, pap and G/Chlamydia sent today Bleeding noted from cervical os, do not suspect hematuria due to lack of other urinary symptoms and physical exam today  she denies night sweats or hot flashes, wonder if she has truly gone through menopause yet versus if this is just her  resuming menses a few years after ablation and has not had menopause yet Await infection results, consider based on results restarting medications for menses regulation returning after ablation versus EMB if this is truly post-menopausal bleeding     Charmel Cooter, MD [facility]

## 2024-03-08 LAB — CERVICOVAGINAL ANCILLARY ONLY
Chlamydia: NEGATIVE
Comment: NEGATIVE
Comment: NEGATIVE
Comment: NORMAL
Neisseria Gonorrhea: NEGATIVE
Trichomonas: NEGATIVE

## 2024-03-09 ENCOUNTER — Encounter: Payer: Self-pay | Admitting: *Deleted

## 2024-03-09 ENCOUNTER — Telehealth: Payer: Self-pay | Admitting: Family Medicine

## 2024-03-09 LAB — CYTOLOGY - PAP
Comment: NEGATIVE
Diagnosis: NEGATIVE
High risk HPV: NEGATIVE

## 2024-03-09 NOTE — Telephone Encounter (Signed)
 Called and left VM discussing normal pap smear and to call clinic back to discuss next steps.  If pt calls back, please let her know I recommend an endometrial biopsy since we are unsure if she has gone through menopause, and she can schedule this with our colposcopy clinic or her PCP Dr Thomas Fleischer.  Mychart message sent as well.  Dr Drue Gerald

## 2024-03-15 NOTE — Telephone Encounter (Signed)
 Patient returns call to nurse line.   Discussed with patient.   Patient scheduled for [facility] on 7/3 for endometrial biopsy.   All questions answered.

## 2024-03-31 ENCOUNTER — Ambulatory Visit

## 2024-04-01 ENCOUNTER — Ambulatory Visit

## 2024-04-26 ENCOUNTER — Other Ambulatory Visit: Payer: Self-pay | Admitting: Medical Genetics

## 2024-04-27 ENCOUNTER — Ambulatory Visit
Admission: RE | Admit: 2024-04-27 | Discharge: 2024-04-27 | Disposition: A | Discharge status: Home / Self Care | Source: Ambulatory Visit | Attending: Family Medicine | Admitting: Family Medicine

## 2024-04-27 DIAGNOSIS — Z1231 Encounter for screening mammogram for malignant neoplasm of breast: Secondary | ICD-10-CM

## 2024-05-03 ENCOUNTER — Ambulatory Visit

## 2024-07-12 ENCOUNTER — Ambulatory Visit: Admitting: Family Medicine

## 2024-07-15 ENCOUNTER — Encounter: Payer: Self-pay | Admitting: Family Medicine

## 2024-07-15 ENCOUNTER — Other Ambulatory Visit (HOSPITAL_COMMUNITY)
Admission: RE | Admit: 2024-07-15 | Discharge: 2024-07-15 | Disposition: A | Source: Ambulatory Visit | Attending: Family Medicine | Admitting: Family Medicine

## 2024-07-15 ENCOUNTER — Ambulatory Visit (INDEPENDENT_AMBULATORY_CARE_PROVIDER_SITE_OTHER): Admitting: Family Medicine

## 2024-07-15 VITALS — BP 98/67 | HR 86 | Ht 61.0 in | Wt 126.6 lb

## 2024-07-15 DIAGNOSIS — N898 Other specified noninflammatory disorders of vagina: Secondary | ICD-10-CM | POA: Insufficient documentation

## 2024-07-15 DIAGNOSIS — J302 Other seasonal allergic rhinitis: Secondary | ICD-10-CM | POA: Diagnosis not present

## 2024-07-15 DIAGNOSIS — Z23 Encounter for immunization: Secondary | ICD-10-CM | POA: Diagnosis not present

## 2024-07-15 DIAGNOSIS — D500 Iron deficiency anemia secondary to blood loss (chronic): Secondary | ICD-10-CM

## 2024-07-15 DIAGNOSIS — Z113 Encounter for screening for infections with a predominantly sexual mode of transmission: Secondary | ICD-10-CM | POA: Diagnosis not present

## 2024-07-15 LAB — POCT WET PREP (WET MOUNT)
Clue Cells Wet Prep Whiff POC: NEGATIVE
Trichomonas Wet Prep HPF POC: ABSENT

## 2024-07-15 MED ORDER — CETIRIZINE HCL 10 MG PO TABS
10.0000 mg | ORAL_TABLET | Freq: Every day | ORAL | 3 refills | Status: AC
Start: 2024-07-15 — End: ?

## 2024-07-15 MED ORDER — FLUCONAZOLE 150 MG PO TABS
150.0000 mg | ORAL_TABLET | Freq: Once | ORAL | 0 refills | Status: AC
Start: 2024-07-15 — End: 2024-07-15

## 2024-07-15 NOTE — Patient Instructions (Signed)
 It was great to see you! Thank you for allowing me to participate in your care!  Our plans for today:   VISIT SUMMARY: Today, you were seen for vaginal discharge and itching, light vaginal bleeding, fatigue, indigestion, and allergies. We discussed your symptoms and created a plan to address each issue.  YOUR PLAN: VAGINAL DISCHARGE AND ITCHING: You have been experiencing vaginal discharge and intense itching for two weeks. -We will perform a microscopy test to confirm if you have a yeast infection. -Pending confirmation, we will prescribe Diflucan . If bacterial vaginosis is present, we will prescribe Flagyl .  SCREENING FOR SEXUALLY TRANSMITTED INFECTIONS: You requested screening for sexually transmitted infections. -We will perform a swab test for gonorrhea, chlamydia, and trichomonas. -We will also perform a blood draw for HIV and syphilis.  FATIGUE: You have been feeling consistently tired, which may be due to low iron levels. -We will order blood work to check your current blood and iron levels. -If your iron levels are low, we will start you on iron supplements and recheck in eight weeks.  ALLERGIC RHINITIS: You requested a refill of your allergy medication. -We have refilled your prescription for Zyrtec .  Please arrive 15 minutes PRIOR to your next scheduled appointment time! If you do not, this affects OTHER patients' care.  Take care and seek immediate care sooner if you develop any concerns.   [name], MD, PGY-3 [facility] 10:51 AM 07/15/2024  [facility]

## 2024-07-15 NOTE — Progress Notes (Signed)
    SUBJECTIVE:   CHIEF COMPLAINT / HPI: yeast infection  Discussed the use of AI scribe software for clinical note transcription with the patient, who gave verbal consent to proceed.  History of Present Illness Kaitlyn West is a 43 year old female who presents with vaginal discharge and itching.  Vaginal discharge and pruritus - Vaginal discharge and intense itching for two weeks - Over-the-counter medications have been ineffective - Sexually active with one female partner  PHQ9 score 10. Negative question 9. - Patient denies feeling depressed - Does feel significant fatigue - Notes history of iron deficiency anemia - History of uterine ablation - Almost two years without bleeding post-ablation - Recent onset of light vaginal bleeding requiring only a panty liner - Consistent tiredness she attributed to low iron levels - Not taking iron supplements due to concerns about constipation  Allergic symptoms - Requests refill of Zyrtec     PERTINENT  PMH / PSH: IDA, Menorrhagia  OBJECTIVE:   BP 98/67   Pulse 86   Ht 5' 1 (1.549 m)   Wt 126 lb 9.6 oz (57.4 kg)   SpO2 100%   BMI 23.92 kg/m   Physical Exam General: NAD, well appearing Neuro: A&O Respiratory: normal WOB on RA Extremities: Moving all 4 extremities equally GU: Normal external genitalia, no obvious erythema, swelling, or lesions. Dempsey cottage-cheese like discharge within the vaginal vault, vaginal walls without erythema or lesions. Cervix visibility reduced due to discharge   ASSESSMENT/PLAN:   Assessment & Plan Vaginal discharge Screening examination for STI Consistent with yeast infection. Routine STI screening. - Cervicovaginal ancillary testing for gonorrhea/chlamydia - HIV, RPR testing - Wet prep - Treat as indicated based on results Seasonal allergies Zyrtec  refilled. Iron deficiency anemia due to chronic blood loss In setting of heavy menstrual bleeding s/p uterine lining ablation. Will  repeat CBC today, if low, restart iron supplementation and recheck CBC in 6 weeks. Encounter for immunization Flu vaccine administered today.   [name], MD, PGY-3 [facility] 10:29 AM 07/15/2024  [facility]

## 2024-07-15 NOTE — Assessment & Plan Note (Signed)
 In setting of heavy menstrual bleeding s/p uterine lining ablation. Will repeat CBC today, if low, restart iron supplementation and recheck CBC in 6 weeks.

## 2024-07-16 ENCOUNTER — Ambulatory Visit: Payer: Self-pay | Admitting: Family Medicine

## 2024-07-16 LAB — CBC
Hematocrit: 41.3 % (ref 34.0–46.6)
Hemoglobin: 13.1 g/dL (ref 11.1–15.9)
MCH: 30.1 pg (ref 26.6–33.0)
MCHC: 31.7 g/dL (ref 31.5–35.7)
MCV: 95 fL (ref 79–97)
Platelets: 278 x10E3/uL (ref 150–450)
RBC: 4.35 x10E6/uL (ref 3.77–5.28)
RDW: 12 % (ref 11.7–15.4)
WBC: 6.5 x10E3/uL (ref 3.4–10.8)

## 2024-07-16 LAB — HIV ANTIBODY (ROUTINE TESTING W REFLEX): HIV Screen 4th Generation wRfx: NONREACTIVE

## 2024-07-16 LAB — CERVICOVAGINAL ANCILLARY ONLY
Chlamydia: NEGATIVE
Comment: NEGATIVE
Comment: NEGATIVE
Comment: NORMAL
Neisseria Gonorrhea: NEGATIVE
Trichomonas: NEGATIVE

## 2024-07-16 LAB — RPR: RPR Ser Ql: NONREACTIVE

## 2024-07-21 ENCOUNTER — Other Ambulatory Visit: Payer: Self-pay | Admitting: Medical Genetics

## 2024-07-21 DIAGNOSIS — Z006 Encounter for examination for normal comparison and control in clinical research program: Secondary | ICD-10-CM

## 2024-09-03 DIAGNOSIS — H6692 Otitis media, unspecified, left ear: Secondary | ICD-10-CM | POA: Diagnosis not present

## 2024-09-03 DIAGNOSIS — R051 Acute cough: Secondary | ICD-10-CM | POA: Diagnosis not present

## 2024-09-03 DIAGNOSIS — J209 Acute bronchitis, unspecified: Secondary | ICD-10-CM | POA: Diagnosis not present

## 2024-09-03 DIAGNOSIS — J029 Acute pharyngitis, unspecified: Secondary | ICD-10-CM | POA: Diagnosis not present
